# Patient Record
Sex: Male | Born: 1985 | Race: White | Hispanic: No | Smoking: Former smoker
Health system: Southern US, Community
[De-identification: ages and names within clinical notes are randomized; demographics above are authoritative.]

## PROBLEM LIST (undated history)

## (undated) DIAGNOSIS — R42 Dizziness and giddiness: Secondary | ICD-10-CM

## (undated) HISTORY — PX: APPENDECTOMY: SHX54

---
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
## 2007-03-02 ENCOUNTER — Observation Stay (HOSPITAL_COMMUNITY): Admission: EM | Admit: 2007-03-02 | Discharge: 2007-03-03 | Payer: Self-pay | Admitting: Emergency Medicine

## 2007-03-02 ENCOUNTER — Encounter (INDEPENDENT_AMBULATORY_CARE_PROVIDER_SITE_OTHER): Payer: Self-pay | Admitting: Specialist

## 2008-08-18 ENCOUNTER — Emergency Department (HOSPITAL_COMMUNITY): Admission: EM | Admit: 2008-08-18 | Discharge: 2008-08-19 | Payer: Self-pay | Admitting: Emergency Medicine

## 2009-05-04 ENCOUNTER — Emergency Department (HOSPITAL_COMMUNITY): Admission: EM | Admit: 2009-05-04 | Discharge: 2009-05-04 | Payer: Self-pay | Admitting: Emergency Medicine

## 2009-10-15 ENCOUNTER — Ambulatory Visit: Payer: Self-pay | Admitting: Diagnostic Radiology

## 2009-10-15 ENCOUNTER — Emergency Department (HOSPITAL_BASED_OUTPATIENT_CLINIC_OR_DEPARTMENT_OTHER): Admission: EM | Admit: 2009-10-15 | Discharge: 2009-10-15 | Payer: Self-pay | Admitting: Emergency Medicine

## 2009-10-29 ENCOUNTER — Emergency Department (HOSPITAL_BASED_OUTPATIENT_CLINIC_OR_DEPARTMENT_OTHER): Admission: EM | Admit: 2009-10-29 | Discharge: 2009-10-29 | Payer: Self-pay | Admitting: Emergency Medicine

## 2009-11-13 ENCOUNTER — Emergency Department (HOSPITAL_BASED_OUTPATIENT_CLINIC_OR_DEPARTMENT_OTHER): Admission: EM | Admit: 2009-11-13 | Discharge: 2009-11-14 | Payer: Self-pay | Admitting: Emergency Medicine

## 2010-04-07 ENCOUNTER — Emergency Department (HOSPITAL_BASED_OUTPATIENT_CLINIC_OR_DEPARTMENT_OTHER): Admission: EM | Admit: 2010-04-07 | Discharge: 2010-04-07 | Payer: Self-pay | Admitting: Emergency Medicine

## 2010-04-09 ENCOUNTER — Emergency Department (HOSPITAL_COMMUNITY): Admission: EM | Admit: 2010-04-09 | Discharge: 2010-04-09 | Payer: Self-pay | Admitting: Emergency Medicine

## 2010-04-11 ENCOUNTER — Emergency Department (HOSPITAL_BASED_OUTPATIENT_CLINIC_OR_DEPARTMENT_OTHER): Admission: EM | Admit: 2010-04-11 | Discharge: 2010-04-11 | Payer: Self-pay | Admitting: Emergency Medicine

## 2010-08-20 ENCOUNTER — Emergency Department (HOSPITAL_BASED_OUTPATIENT_CLINIC_OR_DEPARTMENT_OTHER): Admission: EM | Admit: 2010-08-20 | Discharge: 2010-08-20 | Payer: Self-pay | Admitting: Emergency Medicine

## 2010-08-20 ENCOUNTER — Ambulatory Visit: Payer: Self-pay | Admitting: Diagnostic Radiology

## 2010-10-17 ENCOUNTER — Emergency Department (HOSPITAL_BASED_OUTPATIENT_CLINIC_OR_DEPARTMENT_OTHER): Admission: EM | Admit: 2010-10-17 | Discharge: 2010-10-18 | Payer: Self-pay | Admitting: Emergency Medicine

## 2010-10-18 ENCOUNTER — Ambulatory Visit: Payer: Self-pay | Admitting: Diagnostic Radiology

## 2011-03-14 LAB — CULTURE, ROUTINE-ABSCESS

## 2011-03-29 LAB — URINALYSIS, ROUTINE W REFLEX MICROSCOPIC
Bilirubin Urine: NEGATIVE
Bilirubin Urine: NEGATIVE
Glucose, UA: NEGATIVE mg/dL
Glucose, UA: NEGATIVE mg/dL
Hgb urine dipstick: NEGATIVE
Hgb urine dipstick: NEGATIVE
Ketones, ur: NEGATIVE mg/dL
Ketones, ur: NEGATIVE mg/dL
Nitrite: NEGATIVE
Nitrite: NEGATIVE
Protein, ur: NEGATIVE mg/dL
Protein, ur: NEGATIVE mg/dL
Specific Gravity, Urine: 1.028 (ref 1.005–1.030)
Specific Gravity, Urine: 1.018 (ref 1.005–1.030)
Urobilinogen, UA: 0.2 mg/dL (ref 0.0–1.0)
Urobilinogen, UA: 0.2 mg/dL (ref 0.0–1.0)
pH: 6 (ref 5.0–8.0)
pH: 7 (ref 5.0–8.0)

## 2011-03-29 LAB — URINE MICROSCOPIC-ADD ON

## 2011-03-29 LAB — URINE CULTURE
Colony Count: NO GROWTH
Culture: NO GROWTH

## 2011-03-29 LAB — GC/CHLAMYDIA PROBE AMP, GENITAL
Chlamydia, DNA Probe: NEGATIVE
GC Probe Amp, Genital: NEGATIVE

## 2011-04-04 LAB — DIFFERENTIAL
Basophils Absolute: 0.1 10*3/uL (ref 0.0–0.1)
Basophils Relative: 1 % (ref 0–1)
Eosinophils Absolute: 0.1 10*3/uL (ref 0.0–0.7)
Eosinophils Relative: 1 % (ref 0–5)
Lymphocytes Relative: 34 % (ref 12–46)
Lymphs Abs: 2.1 10*3/uL (ref 0.7–4.0)
Monocytes Absolute: 0.7 10*3/uL (ref 0.1–1.0)
Monocytes Relative: 12 % (ref 3–12)
Neutro Abs: 3.2 10*3/uL (ref 1.7–7.7)
Neutrophils Relative %: 52 % (ref 43–77)

## 2011-04-04 LAB — D-DIMER, QUANTITATIVE: D-Dimer, Quant: 0.35 ug/mL-FEU (ref 0.00–0.48)

## 2011-04-04 LAB — POCT CARDIAC MARKERS
CKMB, poc: <1 ng/mL — ABNORMAL LOW (ref 1.0–8.0)
Myoglobin, poc: 55.3 ng/mL (ref 12–200)
Troponin i, poc: <0.05 ng/mL (ref 0.00–0.09)

## 2011-04-04 LAB — CBC
HCT: 45.3 % (ref 39.0–52.0)
Hemoglobin: 15.9 g/dL (ref 13.0–17.0)
MCHC: 35.2 g/dL (ref 30.0–36.0)
MCV: 86.6 fL (ref 78.0–100.0)
Platelets: 166 10*3/uL (ref 150–400)
RBC: 5.23 MIL/uL (ref 4.22–5.81)
RDW: 13.3 % (ref 11.5–15.5)
WBC: 6.1 10*3/uL (ref 4.0–10.5)

## 2011-04-04 LAB — BASIC METABOLIC PANEL
BUN: 14 mg/dL (ref 6–23)
CO2: 29 mEq/L (ref 19–32)
Calcium: 9.5 mg/dL (ref 8.4–10.5)
Chloride: 103 mEq/L (ref 96–112)
Creatinine, Ser: 1.09 mg/dL (ref 0.4–1.5)
GFR calc Af Amer: >60 mL/min (ref >60)
GFR calc non Af Amer: >60 mL/min (ref >60)
Glucose, Bld: 94 mg/dL (ref 70–99)
Potassium: 4 mEq/L (ref 3.5–5.1)
Sodium: 137 mEq/L (ref 135–145)

## 2011-05-12 NOTE — H&P (Signed)
[name], Page               ACCOUNT NO.:  [id number]   MEDICAL RECORD NO.:  [id number]          PATIENT TYPE:  INP   LOCATION:  5708                         FACILITY:  [facility]   PHYSICIAN:  Maisie Fus A. Cornett, M.D.DATE OF BIRTH:  1986/08/08   DATE OF ADMISSION:  03/02/2007  DATE OF DISCHARGE:                              HISTORY & PHYSICAL   CHIEF COMPLAINT:  Abdominal pain.   HISTORY OF PRESENT ILLNESS:  The patient is a 25 year old male with a 2-  day history of diffuse periumbilical abdominal pain which was mild to  moderate in intensity.  He described the pain as gas.  he woke up  today and the pain was localized in his right lower quadrant.  It was an  8 out of 10, was severe with no radiation.  The pain then progressively  worsened so he sought attention at [facility], who  referred him to the emergency room.  He is describing now right lower  quadrant pain which is moderate to severe in intensity without  radiation.  No associated nausea or vomiting.  He still does have his  appetite.  Denies any recent febrile disease.  CT scan revealed acute  appendicitis.  I was asked to see the patient at the request of Dr.  Ignacia Palma for this.   PAST MEDICAL HISTORY:  None.   PAST SURGICAL HISTORY:  None.   ALLERGIES:  None.   FAMILY HISTORY:  Negative for inflammatory bowel disease.   MEDICATIONS:  None currently.   SOCIAL HISTORY:  He is a smoker.   REVIEW OF SYSTEMS:  A 15-point review of systems otherwise negative  except for that stated above.   PHYSICAL EXAM:  Temperature 97.5, heart rate 63, blood pressure 122/64.  GENERAL APPEARANCE:  White male in no apparent distress.  HEENT:  Extraocular movements are intact.  There is no evidence of  scleral icterus.  Oropharynx moist.  NECK:  Supple, nontender.  Trachea midline.  CHEST:  Clear to auscultation.  Chest wall motion normal.  CARDIOVASCULAR:  Regular rate and rhythm without rub, murmur or gallop.  EXTREMITIES:  Well-perfused.  ABDOMEN:  Positive rebound.  Positive guarding over his right lower  quadrant at McBurney's point.  No mass or hernia noted.  EXTREMITIES:  Muscle tone normal.  Range of motion normal.  NEUROLOGICAL EXAMINATION:  Motor and sensory function are intact.  Cranial nerves II-XII are intact.  Glasgow coma scale 15.   DIAGNOSTIC STUDIES:  I reviewed his radiology reports which shows acute  appendicitis by CT scan done earlier.  No evidence of abscess  perforation.  He has a sodium of 140, potassium 4.1, chloride 103, CO2  27, BUN 13, creatinine 1.18.  Liver functions within normal limits.  He  has a white count of 8600 with no left shift.  Hematocrit 43.7, platelet  count is 173.  Urinalysis is normal.   IMPRESSION:  Acute appendicitis in a 25 year old white male.   PLAN:  I have recommended laparoscopic possible open appendectomy to Mr.  Canal today.  Discussed the procedure with him as well as complications  of bleeding, infection, injury to other organs, abscess formation and  the requirement to go open.  Also abdominal wall hernias and CO2  complications of embolism, cardiovascular and DVT complications.  He has  agreed to proceed.      Thomas A. Cornett, M.D.  Electronically Signed     TAC/MEDQ  D:  03/02/2007  T:  03/02/2007  Job:  [id number]

## 2011-05-12 NOTE — Op Note (Signed)
[name], Ricardo Page               ACCOUNT NO.:  [id number]   MEDICAL RECORD NO.:  [id number]          PATIENT TYPE:  INP   LOCATION:  1826                         FACILITY:  [facility]   PHYSICIAN:  Clovis Pu. Cornett, M.D.DATE OF BIRTH:  1986-08-31   DATE OF PROCEDURE:  DATE OF DISCHARGE:                               OPERATIVE REPORT   PREOPERATIVE DIAGNOSIS:  Acute appendicitis.   POSTOPERATIVE DIAGNOSIS:  Acute appendicitis.   PROCEDURE:  Laparoscopic appendectomy.   SURGEON:  Maisie Fus A. Cornett, M.D.   ANESTHESIA:  General endotracheal anesthesia with 0.25% Sensorcaine.   ESTIMATED BLOOD LOSS:  10 mL.   SPECIMEN:  Appendix to pathology.   INDICATIONS FOR PROCEDURE:  The patient is a 25 year old otherwise  healthy male with acute appendicitis.  He presents to the operating room  for laparoscopic appendectomy.   DESCRIPTION OF PROCEDURE:  The patient was brought to the operating room  and placed supine.  After induction of general anesthesia, his abdomen  was prepped and draped in a sterile fashion.  The right arm was left  out, the left arm was tucked.  A Foley catheter was placed.   A 1-cm incision was made just above the umbilicus.  Dissection was  carried down to his fascia.  The fascia was opened with a scalpel blade.  A pursestring suture of 0 Vicryl was placed in the fascia.  I placed my  finger in the fascial opening and into the abdominal cavity without  difficulty.  A 12-mm Hasson cannula was then placed under direct vision.  Pneumoperitoneum was created to 15 mmHg of CO2.  The laparoscope was  placed.  Laparoscopy was performed.  No evidence of solid or hollow  organ injury.  Gallbladder, stomach and liver all appeared normal.  A 5-  mm left lower quadrant port was placed, and a second 5-mm port was  placed in the right midabdomen.  The appendix was identified was acutely  inflamed.  The harmonic scalpel was used take the mesoappendix down  without difficulty,  with excellent hemostasis.  Once the interface  between the appendix and cecum were encountered, a GIA 35 stapler was  used to fire across the base of the appendix.  The appendix was placed  in an EndoCatch bag and extracted.  The appendiceal stump was examined  and found to be hemostatic with satisfactory closure.  Irrigation was  used and suctioned out.  The patient was flattened out.  Inspection of  the small bowel, large bowel, and remainder of the intra-abdominal  organs revealed no other abnormality or injury.  At this point in time,  the CO2 was allowed to escape.  The ports were removed.  The fascia at  the port site was closed with a pursestring suture of 0  Vicryl.  A 4-0 Monocryl was used to close the skin incisions.  All final  counts of sponge, needle, and instruments were found to be correct at  this portion of the case.  Sterile dressings were applied.   The patient was awakened and taken to recovery in satisfactory  condition.  Thomas A. Cornett, M.D.  Electronically Signed     TAC/MEDQ  D:  03/02/2007  T:  03/03/2007  Job:  [id number]

## 2013-05-26 ENCOUNTER — Encounter (HOSPITAL_COMMUNITY): Payer: Self-pay | Admitting: *Deleted

## 2013-05-26 ENCOUNTER — Emergency Department (HOSPITAL_COMMUNITY): Payer: BC Managed Care – PPO

## 2013-05-26 ENCOUNTER — Emergency Department (HOSPITAL_COMMUNITY)
Admission: EM | Admit: 2013-05-26 | Discharge: 2013-05-26 | Disposition: A | Discharge status: Home / Self Care | Payer: BC Managed Care – PPO | Attending: Emergency Medicine | Admitting: Emergency Medicine

## 2013-05-26 DIAGNOSIS — X58XXXA Exposure to other specified factors, initial encounter: Secondary | ICD-10-CM | POA: Insufficient documentation

## 2013-05-26 DIAGNOSIS — Y9375 Activity, martial arts: Secondary | ICD-10-CM | POA: Insufficient documentation

## 2013-05-26 DIAGNOSIS — S99929A Unspecified injury of unspecified foot, initial encounter: Secondary | ICD-10-CM | POA: Insufficient documentation

## 2013-05-26 DIAGNOSIS — Y9239 Other specified sports and athletic area as the place of occurrence of the external cause: Secondary | ICD-10-CM | POA: Insufficient documentation

## 2013-05-26 DIAGNOSIS — S8990XA Unspecified injury of unspecified lower leg, initial encounter: Secondary | ICD-10-CM | POA: Insufficient documentation

## 2013-05-26 DIAGNOSIS — S8992XA Unspecified injury of left lower leg, initial encounter: Secondary | ICD-10-CM

## 2013-05-26 DIAGNOSIS — Y92838 Other recreation area as the place of occurrence of the external cause: Secondary | ICD-10-CM | POA: Insufficient documentation

## 2013-05-26 MED ORDER — IBUPROFEN 800 MG PO TABS: 800.0000 mg | ORAL_TABLET | Freq: Three times a day (TID) | ORAL | Status: DC

## 2013-05-26 MED ORDER — HYDROCODONE-ACETAMINOPHEN 5-325 MG PO TABS: 1.0000 | ORAL_TABLET | ORAL | Status: DC | PRN

## 2013-05-26 MED ORDER — IBUPROFEN 800 MG PO TABS
800.0000 mg | ORAL_TABLET | Freq: Once | ORAL | Status: AC
  Administered 2013-05-26: 800 mg via ORAL
  Filled 2013-05-26: qty 1

## 2013-05-26 NOTE — ED Notes (Signed)
Pain rt knee , onset in martial arts training.

## 2013-05-26 NOTE — ED Provider Notes (Signed)
History     CSN: [id number]  Arrival date & time 05/26/13  2019   First MD Initiated Contact with Patient 05/26/13 2058      Chief Complaint  Patient presents with  . Knee Injury    (Consider location/radiation/quality/duration/timing/severity/associated sxs/prior treatment) HPI Ricardo Page is a 27 y.o. male who presents to the ED with right knee pain that started while he was in his martial arts training tonight. He states he was doing his training and felt a pulling sharp pain in his right knee. No other injuries. The history was provided by the patient.  History reviewed. No pertinent past medical history.  Past Surgical History  Procedure Laterality Date  . Appendectomy      History reviewed. No pertinent family history.  History  Substance Use Topics  . Smoking status: Never Smoker   . Smokeless tobacco: Not on file  . Alcohol Use: Yes      Review of Systems  Constitutional: Negative for fever.  HENT: Negative for neck pain.   Gastrointestinal: Negative for nausea and vomiting.  Musculoskeletal:       Right knee pain   Skin: Negative for wound.  Psychiatric/Behavioral: The patient is not nervous/anxious.     Allergies  Review of patient's allergies indicates no known allergies.  Home Medications  No current outpatient prescriptions on file.  BP 128/78  Pulse 79  Temp(Src) 98.4 F (36.9 C) (Oral)  Resp 24  Ht 6\' 1"  (1.854 m)  Wt 250 lb (113.399 kg)  BMI 32.99 kg/m2  SpO2 100%  Physical Exam  Nursing note and vitals reviewed. Constitutional: He is oriented to person, place, and time. He appears well-developed and well-nourished. No distress.  HENT:  Head: Atraumatic.  Eyes: EOM are normal.  Neck: Normal range of motion. Neck supple.  Cardiovascular: Normal rate.   Pulmonary/Chest: Effort normal.  Abdominal: Soft. There is no tenderness.  Musculoskeletal:       Right knee: He exhibits decreased range of motion and swelling. He exhibits no  laceration and no erythema. Tenderness found. MCL tenderness noted.  Pedal pulses strong and equal bilateral. Adequate circulation, good touch sensation. Pain with flexion and internal rotation.  Neurological: He is alert and oriented to person, place, and time. No cranial nerve deficit.  Skin: Skin is warm and dry.  Psychiatric: He has a normal mood and affect.    ED Course  Procedures (including critical care time)  Labs Reviewed - No data to display Dg Knee Complete 4 Views Right  05/26/2013   *RADIOLOGY REPORT*  Clinical Data: Knee pain.  Knee injury.  RIGHT KNEE - COMPLETE 4+ VIEW  Comparison: None.  Findings: Anatomic alignment.  No fracture.  Joint spaces preserved.  No effusion.  IMPRESSION: Negative.   Original Report Authenticated By: Andreas Newport, M.D.    MDM  27 y.o. male with right knee injury. I have reviewed this patient's vital signs, nurses notes, appropriate labs and imaging.  I have discussed findings with the patient and plan of care. Will apply knee immobilizer and he will use crutches, apply ice, elevate and follow up with ortho. Patient voices understanding. Patient stable for discharge home without any immediate complications, no signs of compartment syndrome at this time.   Medication List    TAKE these medications       HYDROcodone-acetaminophen 5-325 MG per tablet  Commonly known as:  NORCO/VICODIN  Take 1 tablet by mouth every 4 (four) hours as needed.  ibuprofen 800 MG tablet  Commonly known as:  ADVIL,MOTRIN  Take 1 tablet (800 mg total) by mouth 3 (three) times daily.               [facility] Orlene Och, [place] 05/27/13 [phone]

## 2013-05-27 NOTE — ED Provider Notes (Signed)
Medical screening examination/treatment/procedure(s) were performed by non-physician practitioner and as supervising physician I was immediately available for consultation/collaboration.  Donnetta Hutching, MD 05/27/13 [phone]

## 2014-01-09 ENCOUNTER — Emergency Department (HOSPITAL_COMMUNITY)
Admission: EM | Admit: 2014-01-09 | Discharge: 2014-01-10 | Disposition: A | Discharge status: Home / Self Care | Payer: Self-pay | Attending: Emergency Medicine | Admitting: Emergency Medicine

## 2014-01-09 ENCOUNTER — Emergency Department (HOSPITAL_COMMUNITY): Payer: Self-pay

## 2014-01-09 DIAGNOSIS — Z791 Long term (current) use of non-steroidal anti-inflammatories (NSAID): Secondary | ICD-10-CM | POA: Insufficient documentation

## 2014-01-09 DIAGNOSIS — J159 Unspecified bacterial pneumonia: Secondary | ICD-10-CM | POA: Insufficient documentation

## 2014-01-09 DIAGNOSIS — J189 Pneumonia, unspecified organism: Secondary | ICD-10-CM

## 2014-01-09 MED ORDER — PREDNISONE 50 MG PO TABS
60.0000 mg | ORAL_TABLET | Freq: Once | ORAL | Status: AC
  Administered 2014-01-09: 60 mg via ORAL
  Filled 2014-01-09 (×2): qty 1

## 2014-01-09 MED ORDER — IPRATROPIUM BROMIDE 0.02 % IN SOLN
0.5000 mg | Freq: Once | RESPIRATORY_TRACT | Status: AC
  Administered 2014-01-10: 0.5 mg via RESPIRATORY_TRACT
  Filled 2014-01-09: qty 2.5

## 2014-01-09 MED ORDER — ALBUTEROL SULFATE (2.5 MG/3ML) 0.083% IN NEBU
5.0000 mg | INHALATION_SOLUTION | Freq: Once | RESPIRATORY_TRACT | Status: AC
  Administered 2014-01-10: 5 mg via RESPIRATORY_TRACT
  Filled 2014-01-09: qty 6

## 2014-01-09 NOTE — ED Notes (Signed)
Pt states symptoms are worse when coughing

## 2014-01-09 NOTE — ED Notes (Signed)
Chest wall pain with cough x 4 days

## 2014-01-09 NOTE — ED Provider Notes (Signed)
CSN: [id number]     Arrival date & time 01/09/14  2307 History   First MD Initiated Contact with Patient 01/09/14 2318     Chief Complaint  Patient presents with  . chest wall pain    . Cough   (Consider location/radiation/quality/duration/timing/severity/associated sxs/prior Treatment) HPI Comments: JOHNRYAN SAO is a 28 y.o. male who presents to the Emergency Department complaining of cough for 4 days.  States the cough is productive of green sputum at times. He states that he developed chest tightness 2-3 hrs prior to calling EMS. States the episode lasted approximately 15 minutes.   States the tightness began after forceful coughing.  He also c/o "soreness" to his chest with deep breathing and palpation over his sternum.  He reports some nasal congestion as well.  He denies fever, chills, abdominal pain, shortness of breath, hx of PNA, smoking or drug use.    Patient is a 28 y.o. male presenting with cough. The history is provided by the patient.  Cough Associated symptoms: chest pain and rhinorrhea   Associated symptoms: no chills, no fever, no headaches, no rash, no shortness of breath, no sore throat and no wheezing     No past medical history on file. Past Surgical History  Procedure Laterality Date  . Appendectomy     No family history on file. History  Substance Use Topics  . Smoking status: Never Smoker   . Smokeless tobacco: Not on file  . Alcohol Use: Yes    Review of Systems  Constitutional: Negative for fever, chills, activity change and appetite change.  HENT: Positive for congestion and rhinorrhea. Negative for facial swelling, sore throat and trouble swallowing.   Eyes: Negative for visual disturbance.  Respiratory: Positive for cough and chest tightness. Negative for shortness of breath, wheezing and stridor.   Cardiovascular: Positive for chest pain.  Gastrointestinal: Negative for nausea, vomiting, abdominal pain and abdominal distention.  Genitourinary:  Negative for dysuria and flank pain.  Musculoskeletal: Negative for back pain, neck pain and neck stiffness.  Skin: Negative.  Negative for rash.  Neurological: Negative for dizziness, weakness, numbness and headaches.  Hematological: Negative for adenopathy.  Psychiatric/Behavioral: Negative for confusion.  All other systems reviewed and are negative.    Allergies  Review of patient's allergies indicates no known allergies.  Home Medications   Current Outpatient Rx  Name  Route  Sig  Dispense  Refill  . HYDROcodone-acetaminophen (NORCO/VICODIN) 5-325 MG per tablet   Oral   Take 1 tablet by mouth every 4 (four) hours as needed.   15 tablet   0   . ibuprofen (ADVIL,MOTRIN) 800 MG tablet   Oral   Take 1 tablet (800 mg total) by mouth 3 (three) times daily.   21 tablet   0    BP 119/67  Pulse 79  Temp(Src) 98.5 F (36.9 C) (Oral)  Resp 18  Ht 6\' 1"  (1.854 m)  Wt 263 lb (119.296 kg)  BMI 34.71 kg/m2  SpO2 97%  Physical Exam  Nursing note and vitals reviewed. Constitutional: He is oriented to person, place, and time. He appears well-developed and well-nourished. No distress.  HENT:  Head: Normocephalic and atraumatic.  Right Ear: Tympanic membrane and ear canal normal.  Left Ear: Tympanic membrane and ear canal normal.  Mouth/Throat: Uvula is midline and mucous membranes are normal. Posterior oropharyngeal erythema present. No oropharyngeal exudate, posterior oropharyngeal edema or tonsillar abscesses.  Eyes: EOM are normal. Pupils are equal, round, and reactive to light.  Neck: Normal range of motion, full passive range of motion without pain and phonation normal. Neck supple.  Cardiovascular: Normal rate, regular rhythm, normal heart sounds and intact distal pulses.   No murmur heard. Pulmonary/Chest: Effort normal. No stridor. No respiratory distress. He has wheezes. He has no rales. He exhibits tenderness. He exhibits no mass, no laceration, no crepitus, no edema,  no deformity, no swelling and no retraction.    Slightly diminished lungs sounds bilaterally with few expiratory and inspiratory wheezes.  No rales or stridor  Abdominal: Soft. He exhibits no distension. There is no tenderness. There is no rebound.  Musculoskeletal: He exhibits no edema.  Lymphadenopathy:    He has no cervical adenopathy.  Neurological: He is alert and oriented to person, place, and time. He exhibits normal muscle tone. Coordination normal.  Skin: Skin is warm and dry.    ED Course  Procedures (including critical care time) Labs Review Labs Reviewed - No data to display Imaging Review Dg Chest 2 View  01/10/2014   CLINICAL DATA:  Productive cough, chest tightness, shortness of breath  EXAM: CHEST  2 VIEW  COMPARISON:  05/04/2009  FINDINGS: Although equivocal, very mild patchy right upper lobe opacity is possible. Pneumonia is not excluded.  Lungs otherwise clear.  No pleural effusion or pneumothorax.  The heart is normal in size.  Visualized osseous structures are within normal limits.  IMPRESSION: Possible mild patchy right upper lobe opacity, equivocal, pneumonia not excluded.   Electronically Signed   By: Charline BillsSriyesh  Krishnan M.D.   On: 01/10/2014 00:19    EKG Interpretation   None       MDM    CXR results reviewed and discussed with pateint.  VSS.  Patient is well appearing.  No concerning sx's for PE.    Lung sounds improved after albuterol neb.  Prednisone given.  Patient is feeling better.  VSS.  Will treat with zithromax , albuterol inhaler dispensed.  Pt agrees to return here if sx's worsen, tylenol and ibuprofen if needed for fever.   Berlyn Saylor L. Trisha Mangleriplett, PA-C 01/10/14 [phone]

## 2014-01-10 MED ORDER — AZITHROMYCIN 250 MG PO TABS
500.0000 mg | ORAL_TABLET | Freq: Once | ORAL | Status: AC
  Administered 2014-01-10: 500 mg via ORAL
  Filled 2014-01-10: qty 2

## 2014-01-10 MED ORDER — ALBUTEROL SULFATE HFA 108 (90 BASE) MCG/ACT IN AERS
2.0000 | INHALATION_SPRAY | Freq: Once | RESPIRATORY_TRACT | Status: AC
  Administered 2014-01-10: 2 via RESPIRATORY_TRACT
  Filled 2014-01-10: qty 6.7

## 2014-01-10 MED ORDER — AZITHROMYCIN 250 MG PO TABS: ORAL_TABLET | ORAL | Status: DC

## 2014-01-10 NOTE — ED Provider Notes (Signed)
Medical screening examination/treatment/procedure(s) were performed by non-physician practitioner and as supervising physician I was immediately available for consultation/collaboration.  EKG Interpretation   None        Desirre Eickhoff, MD 01/10/14 0629 

## 2014-01-10 NOTE — Discharge Instructions (Signed)
Pneumonia, Adult °Pneumonia is an infection of the lungs. It may be caused by a germ (virus or bacteria). Some types of pneumonia can spread easily from person to person. This can happen when you cough or sneeze. °HOME CARE °· Only take medicine as told by your doctor. °· Take your medicine (antibiotics) as told. Finish it even if you start to feel better. °· Do not smoke. °· You may use a vaporizer or humidifier in your room. This can help loosen thick spit (mucus). °· Sleep so you are almost sitting up (semi-upright). This helps reduce coughing. °· Rest. °A shot (vaccine) can help prevent pneumonia. Shots are often advised for: °· People over 65 years old. °· Patients on chemotherapy. °· People with long-term (chronic) lung problems. °· People with immune system problems. °GET HELP RIGHT AWAY IF:  °· You are getting worse. °· You cannot control your cough, and you are losing sleep. °· You cough up blood. °· Your pain gets worse, even with medicine. °· You have a fever. °· Any of your problems are getting worse, not better. °· You have shortness of breath or chest pain. °MAKE SURE YOU:  °· Understand these instructions. °· Will watch your condition. °· Will get help right away if you are not doing well or get worse. °Document Released: 05/29/2008 Document Revised: 03/04/2012 Document Reviewed: 03/03/2011 °[employer]® Patient Information ©2014 [employer]. ° °

## 2014-01-10 NOTE — ED Notes (Signed)
Patient given discharge instruction, verbalized understand. IV removed, band aid applied. Patient ambulatory out of the department.  

## 2014-08-24 ENCOUNTER — Other Ambulatory Visit: Payer: Self-pay | Admitting: Family Medicine

## 2015-05-27 ENCOUNTER — Encounter (HOSPITAL_BASED_OUTPATIENT_CLINIC_OR_DEPARTMENT_OTHER): Payer: Self-pay | Admitting: *Deleted

## 2015-05-27 ENCOUNTER — Emergency Department (HOSPITAL_BASED_OUTPATIENT_CLINIC_OR_DEPARTMENT_OTHER)
Admission: EM | Admit: 2015-05-27 | Discharge: 2015-05-27 | Disposition: A | Discharge status: Home / Self Care | Payer: No Typology Code available for payment source | Attending: Emergency Medicine | Admitting: Emergency Medicine

## 2015-05-27 DIAGNOSIS — M545 Low back pain, unspecified: Secondary | ICD-10-CM

## 2015-05-27 LAB — URINALYSIS, ROUTINE W REFLEX MICROSCOPIC
BILIRUBIN URINE: NEGATIVE
GLUCOSE, UA: NEGATIVE mg/dL
Hgb urine dipstick: NEGATIVE
KETONES UR: NEGATIVE mg/dL
Leukocytes, UA: NEGATIVE
Nitrite: NEGATIVE
PH: 7 (ref 5.0–8.0)
Protein, ur: NEGATIVE mg/dL
Specific Gravity, Urine: 1.019 (ref 1.005–1.030)
Urobilinogen, UA: 0.2 mg/dL (ref 0.0–1.0)

## 2015-05-27 MED ORDER — IBUPROFEN 800 MG PO TABS: 800.0000 mg | ORAL_TABLET | Freq: Three times a day (TID) | ORAL | Status: DC

## 2015-05-27 MED ORDER — CYCLOBENZAPRINE HCL 5 MG PO TABS: 5.0000 mg | ORAL_TABLET | Freq: Two times a day (BID) | ORAL | Status: DC | PRN

## 2015-05-27 NOTE — ED Provider Notes (Signed)
CSN: [id number]     Arrival date & time 05/27/15  1121 History   First MD Initiated Contact with Patient 05/27/15 1201     Chief Complaint  Patient presents with  . Flank Pain     (Consider location/radiation/quality/duration/timing/severity/associated sxs/prior Treatment) HPI Comments: Pt c/o left lower back pain that started last night. Worse with movement. Denies dysuria. No known injury. Denies numbness, weakness, or incontinence. Denies fever. Hasn't taken anything for the symptoms. States that he has had some back problems in the past. States that certain position are better then others  The history is provided by the patient. No language interpreter was used.    History reviewed. No pertinent past medical history. Past Surgical History  Procedure Laterality Date  . Appendectomy     No family history on file. History  Substance Use Topics  . Smoking status: Never Smoker   . Smokeless tobacco: Not on file  . Alcohol Use: Yes    Review of Systems  All other systems reviewed and are negative.     Allergies  Review of patient's allergies indicates no known allergies.  Home Medications   Prior to Admission medications   Medication Sig Start Date End Date Taking? Authorizing Provider  azithromycin (ZITHROMAX Z-PAK) 250 MG tablet Take two tablets on day one, then one tab qd days 2-5 01/10/14   Tammy Triplett, PA-C  cyclobenzaprine (FLEXERIL) 5 MG tablet Take 1 tablet (5 mg total) by mouth 2 (two) times daily as needed. 05/27/15   Teressa LowerVrinda Zerline Melchior, NP  HYDROcodone-acetaminophen (NORCO/VICODIN) 5-325 MG per tablet Take 1 tablet by mouth every 4 (four) hours as needed. 05/26/13   Hope Orlene OchM Neese, NP  ibuprofen (ADVIL,MOTRIN) 800 MG tablet Take 1 tablet (800 mg total) by mouth 3 (three) times daily. 05/27/15   Teressa LowerVrinda Chaunta Bejarano, NP   BP 125/67 mmHg  Pulse 78  Temp(Src) 98.6 F (37 C) (Oral)  Resp 18  Ht 6\' 1"  (1.854 m)  Wt 265 lb (120.203 kg)  BMI 34.97 kg/m2  SpO2 100% Physical  Exam  Constitutional: He is oriented to person, place, and time. He appears well-developed and well-nourished.  Cardiovascular: Normal rate and regular rhythm.   Pulmonary/Chest: Effort normal and breath sounds normal.  Abdominal: Soft. Bowel sounds are normal.  Musculoskeletal:  Left lumbar paraspinal tenderness. Equal strength and strength and sensation to bilateral lower extremities.  Neurological: He is alert and oriented to person, place, and time. He exhibits normal muscle tone. Coordination normal.  Skin: Skin is warm and dry.  Nursing note and vitals reviewed.   ED Course  Procedures (including critical care time) Labs Review Labs Reviewed  URINALYSIS, ROUTINE W REFLEX MICROSCOPIC (NOT [facility])    Imaging Review No results found.   EKG Interpretation None      MDM   Final diagnoses:  Left-sided low back pain without sciatica    No red flags. Don't think it is a kidney stone. Pt given flexeril and ibuprofen for pain    Teressa LowerVrinda Skyleigh Windle, NP 05/27/15 1230  Zadie Rhineonald Wickline, MD 05/27/15 1447

## 2015-05-27 NOTE — ED Notes (Signed)
Left flank pain since last night

## 2015-05-27 NOTE — Discharge Instructions (Signed)
Back Pain, Adult Low back pain is very common. About 1 in 5 people have back pain.The cause of low back pain is rarely dangerous. The pain often gets better over time.About half of people with a sudden onset of back pain feel better in just 2 weeks. About 8 in 10 people feel better by 6 weeks.  CAUSES Some common causes of back pain include:  Strain of the muscles or ligaments supporting the spine.  Wear and tear (degeneration) of the spinal discs.  Arthritis.  Direct injury to the back. DIAGNOSIS Most of the time, the direct cause of low back pain is not known.However, back pain can be treated effectively even when the exact cause of the pain is unknown.Answering your caregiver's questions about your overall health and symptoms is one of the most accurate ways to make sure the cause of your pain is not dangerous. If your caregiver needs more information, he or she may order lab work or imaging tests (X-rays or MRIs).However, even if imaging tests show changes in your back, this usually does not require surgery. HOME CARE INSTRUCTIONS For many people, back pain returns.Since low back pain is rarely dangerous, it is often a condition that people can learn to manageon their own.   Remain active. It is stressful on the back to sit or stand in one place. Do not sit, drive, or stand in one place for more than 30 minutes at a time. Take short walks on level surfaces as soon as pain allows.Try to increase the length of time you walk each day.  Do not stay in bed.Resting more than 1 or 2 days can delay your recovery.  Do not avoid exercise or work.Your body is made to move.It is not dangerous to be active, even though your back may hurt.Your back will likely heal faster if you return to being active before your pain is gone.  Pay attention to your body when you bend and lift. Many people have less discomfortwhen lifting if they bend their knees, keep the load close to their bodies,and  avoid twisting. Often, the most comfortable positions are those that put less stress on your recovering back.  Find a comfortable position to sleep. Use a firm mattress and lie on your side with your knees slightly bent. If you lie on your back, put a pillow under your knees.  Only take over-the-counter or prescription medicines as directed by your caregiver. Over-the-counter medicines to reduce pain and inflammation are often the most helpful.Your caregiver may prescribe muscle relaxant drugs.These medicines help dull your pain so you can more quickly return to your normal activities and healthy exercise.  Put ice on the injured area.  Put ice in a plastic bag.  Place a towel between your skin and the bag.  Leave the ice on for 15-20 minutes, 03-04 times a day for the first 2 to 3 days. After that, ice and heat may be alternated to reduce pain and spasms.  Ask your caregiver about trying back exercises and gentle massage. This may be of some benefit.  Avoid feeling anxious or stressed.Stress increases muscle tension and can worsen back pain.It is important to recognize when you are anxious or stressed and learn ways to manage it.Exercise is a great option. SEEK MEDICAL CARE IF:  You have pain that is not relieved with rest or medicine.  You have pain that does not improve in 1 week.  You have new symptoms.  You are generally not feeling well. SEEK   IMMEDIATE MEDICAL CARE IF:   You have pain that radiates from your back into your legs.  You develop new bowel or bladder control problems.  You have unusual weakness or numbness in your arms or legs.  You develop nausea or vomiting.  You develop abdominal pain.  You feel faint. Document Released: 12/11/2005 Document Revised: 06/11/2012 Document Reviewed: 04/14/2014 Exit[employer] Patient Information 2015 [employer]. This information is not intended to replace advice given to you by your health care provider. Make sure you  discuss any questions you have with your health care provider.  

## 2016-06-02 ENCOUNTER — Other Ambulatory Visit: Payer: Self-pay | Admitting: Occupational Medicine

## 2016-06-02 ENCOUNTER — Ambulatory Visit: Payer: Self-pay

## 2016-06-02 DIAGNOSIS — M25562 Pain in left knee: Secondary | ICD-10-CM

## 2016-08-07 ENCOUNTER — Emergency Department (HOSPITAL_COMMUNITY)
Admission: EM | Admit: 2016-08-07 | Discharge: 2016-08-07 | Disposition: A | Discharge status: Home / Self Care | Payer: 59 | Attending: Emergency Medicine | Admitting: Emergency Medicine

## 2016-08-07 ENCOUNTER — Emergency Department (HOSPITAL_COMMUNITY): Payer: 59

## 2016-08-07 ENCOUNTER — Encounter (HOSPITAL_COMMUNITY): Payer: Self-pay | Admitting: *Deleted

## 2016-08-07 DIAGNOSIS — Y9239 Other specified sports and athletic area as the place of occurrence of the external cause: Secondary | ICD-10-CM | POA: Diagnosis not present

## 2016-08-07 DIAGNOSIS — Z79899 Other long term (current) drug therapy: Secondary | ICD-10-CM | POA: Diagnosis not present

## 2016-08-07 DIAGNOSIS — Y9367 Activity, basketball: Secondary | ICD-10-CM | POA: Diagnosis not present

## 2016-08-07 DIAGNOSIS — Y999 Unspecified external cause status: Secondary | ICD-10-CM | POA: Insufficient documentation

## 2016-08-07 DIAGNOSIS — M25562 Pain in left knee: Secondary | ICD-10-CM | POA: Diagnosis not present

## 2016-08-07 DIAGNOSIS — X501XXA Overexertion from prolonged static or awkward postures, initial encounter: Secondary | ICD-10-CM | POA: Insufficient documentation

## 2016-08-07 MED ORDER — NAPROXEN 500 MG PO TABS
500.0000 mg | ORAL_TABLET | Freq: Two times a day (BID) | ORAL | 0 refills | Status: DC
Start: 2016-08-07 — End: 2016-12-04

## 2016-08-07 MED ORDER — HYDROCODONE-ACETAMINOPHEN 5-325 MG PO TABS: 1.0000 | ORAL_TABLET | ORAL | 0 refills | Status: DC | PRN

## 2016-08-07 MED ORDER — KETOROLAC TROMETHAMINE 60 MG/2ML IM SOLN
30.0000 mg | Freq: Once | INTRAMUSCULAR | Status: AC
  Administered 2016-08-07: 30 mg via INTRAMUSCULAR
  Filled 2016-08-07: qty 2

## 2016-08-07 NOTE — ED Triage Notes (Signed)
The pt was working out last pm  Twisted his lt knee and I felt like it slipped olut of place  Ice and elevation has not helped the pain

## 2016-08-07 NOTE — ED Provider Notes (Signed)
[facility]-EMERGENCY DEPT Provider Note   CSN: [id number] Arrival date & time: 08/07/16  [id number]  First Provider Contact:  First MD Initiated Contact with Patient 08/07/16 0555     History   Chief Complaint Chief Complaint  Patient presents with  . Knee Pain   HPI  Ricardo Page is an 30 y.o. male with no significant PMH who presents to the ED for evaluation of left knee pain. He states he was in his usual state of health until yesterday evening when he was playing basketball at the gym when he thinks he landed after a jump shot and pivoted, then suddenly felt a sharp pain and pop in his left knee. He states his knee buckled but he did not fall. He states that since then he has had constant, worsening pain along the medial and superior aspect of his left knee. He has taken ibuprofen at home about six hours ago with no relief. He states he is still able to bear weight and ambulate though it is painful. He states he has injured this knee before in high school and was told he had a meniscal injury but never required surgery. Denies fever or chills. Denies new numbness, weakness, or tingling. He states he typically follows with [facility].  History reviewed. No pertinent past medical history.  There are no active problems to display for this patient.   Past Surgical History:  Procedure Laterality Date  . APPENDECTOMY         Home Medications    Prior to Admission medications   Medication Sig Start Date End Date Taking? Authorizing Provider  azithromycin (ZITHROMAX Z-PAK) 250 MG tablet Take two tablets on day one, then one tab qd days 2-5 01/10/14   Tammy Triplett, PA-C  cyclobenzaprine (FLEXERIL) 5 MG tablet Take 1 tablet (5 mg total) by mouth 2 (two) times daily as needed. 05/27/15   Teressa LowerVrinda Pickering, NP  HYDROcodone-acetaminophen (NORCO/VICODIN) 5-325 MG per tablet Take 1 tablet by mouth every 4 (four) hours as needed. 05/26/13   Hope Orlene OchM Neese, NP  ibuprofen (ADVIL,MOTRIN) 800 MG  tablet Take 1 tablet (800 mg total) by mouth 3 (three) times daily. 05/27/15   Teressa LowerVrinda Pickering, NP    Family History No family history on file.  Social History Social History  Substance Use Topics  . Smoking status: Never Smoker  . Smokeless tobacco: Never Used  . Alcohol use Yes     Allergies   Review of patient's allergies indicates no known allergies.   Review of Systems Review of Systems 10 Systems reviewed and are negative for acute change except as noted in the HPI.  Physical Exam Updated Vital Signs BP 119/69   Pulse 66   Temp 97.8 F (36.6 C)   Resp 16   Ht 6\' 1"  (1.854 m)   Wt 114.8 kg   SpO2 97%   BMI 33.38 kg/m   Physical Exam  Constitutional: He is oriented to person, place, and time. No distress.  HENT:  Head: Atraumatic.  Right Ear: External ear normal.  Left Ear: External ear normal.  Nose: Nose normal.  Eyes: Conjunctivae are normal. No scleral icterus.  Cardiovascular: Normal rate and regular rhythm.   Pulmonary/Chest: Effort normal. No respiratory distress.  Abdominal: He exhibits no distension.  Musculoskeletal:  Left knee with tenderness along medial edge with particular tenderness at 11:00 position. No discoloration. Minimal generalized edema. Limited ROM due to pain. Negative lachman's and anterior drawer test. +pain with valgus stress 2+DP and  PT  Neurological: He is alert and oriented to person, place, and time.  Skin: Skin is warm and dry. He is not diaphoretic.  Psychiatric: He has a normal mood and affect. His behavior is normal.  Nursing note and vitals reviewed.    ED Treatments / Results  Labs (all labs ordered are listed, but only abnormal results are displayed) Labs Reviewed - No data to display  EKG  EKG Interpretation None       Radiology Dg Knee Complete 4 Views Left  Result Date: 08/07/2016 CLINICAL DATA:  Initial evaluation for acute medial knee pain status post basketball injury. EXAM: LEFT KNEE - COMPLETE 4+  VIEW COMPARISON:  Prior radiograph from 06/02/2016. FINDINGS: No evidence of fracture, dislocation, or joint effusion. No evidence of arthropathy or other focal bone abnormality. Soft tissues are unremarkable. IMPRESSION: No acute osseous abnormality identified about the left knee. Electronically Signed   By: Rise MuBenjamin  McClintock M.D.   On: 08/07/2016 06:25    Procedures Procedures (including critical care time)  Medications Ordered in ED Medications  ketorolac (TORADOL) injection 30 mg (30 mg Intramuscular Given 08/07/16 0707)     Initial Impression / Assessment and Plan / ED Course  I have reviewed the triage vital signs and the nursing notes.  Pertinent labs & imaging results that were available during my care of the patient were reviewed by me and considered in my medical decision making (see chart for details).  Clinical Course   X-ray negative for acute findings. I do suspect pt likely has some meniscal injury, and/or ligamentous injury. He is neurovascularly intact. Discussed with pt no indication for emergent MRI at this time. He can bear weight though it is painful. No overt laxity on exam. Will place in knee immobilizer, provide crutches, and provide rx for pain meds. Encouraged close ortho f/u. Pt has seen [facility] in the past. Work note given per pt request. ER return precautions given.  Final Clinical Impressions(s) / ED Diagnoses   Final diagnoses:  Left knee pain    New Prescriptions New Prescriptions   HYDROCODONE-ACETAMINOPHEN (NORCO/VICODIN) 5-325 MG TABLET    Take 1 tablet by mouth every 4 (four) hours as needed for severe pain.   NAPROXEN (NAPROSYN) 500 MG TABLET    Take 1 tablet (500 mg total) by mouth 2 (two) times daily.     Carlene CoriaSerena Y Kazuki Ingle, PA-C 08/07/16 [id number]    Charlynne Panderavid Hsienta Yao, MD 08/11/16 1539

## 2016-08-07 NOTE — Progress Notes (Signed)
Orthopedic Tech Progress Note Patient Details:  Ricardo GeraldsMichael R Page 11/05/1986 [id number]  Ortho Devices Type of Ortho Device: Crutches, Knee Immobilizer Ortho Device/Splint Location: lle Ortho Device/Splint Interventions: Ordered, Application   Trinna PostMartinez, Dyanna Seiter J 08/07/2016, 7:30 AM

## 2016-08-07 NOTE — Discharge Instructions (Signed)
Take pain medications as prescribed as needed for pain. Please call [facility] to schedule a follow up appointment as soon as possible. Your knee x-ray today was unremarkable but as we discussed I do suspect you might have a meniscal injury, or injury of your other soft tissues. Keep you leg elevated and use ice on and off for the next 48 hrs. Return to the ER for new or worsening symptoms.

## 2016-12-03 DIAGNOSIS — W231XXA Caught, crushed, jammed, or pinched between stationary objects, initial encounter: Secondary | ICD-10-CM | POA: Insufficient documentation

## 2016-12-03 DIAGNOSIS — Y929 Unspecified place or not applicable: Secondary | ICD-10-CM | POA: Diagnosis not present

## 2016-12-03 DIAGNOSIS — S5012XA Contusion of left forearm, initial encounter: Secondary | ICD-10-CM | POA: Diagnosis not present

## 2016-12-03 DIAGNOSIS — Y999 Unspecified external cause status: Secondary | ICD-10-CM | POA: Insufficient documentation

## 2016-12-03 DIAGNOSIS — S59912A Unspecified injury of left forearm, initial encounter: Secondary | ICD-10-CM | POA: Diagnosis present

## 2016-12-03 DIAGNOSIS — Y939 Activity, unspecified: Secondary | ICD-10-CM | POA: Diagnosis not present

## 2016-12-04 ENCOUNTER — Emergency Department (HOSPITAL_COMMUNITY)
Admission: EM | Admit: 2016-12-04 | Discharge: 2016-12-04 | Disposition: A | Discharge status: Home / Self Care | Payer: 59 | Attending: Emergency Medicine | Admitting: Emergency Medicine

## 2016-12-04 ENCOUNTER — Emergency Department (HOSPITAL_COMMUNITY): Payer: 59

## 2016-12-04 ENCOUNTER — Encounter (HOSPITAL_COMMUNITY): Payer: Self-pay

## 2016-12-04 DIAGNOSIS — S4992XA Unspecified injury of left shoulder and upper arm, initial encounter: Secondary | ICD-10-CM

## 2016-12-04 MED ORDER — NAPROXEN 500 MG PO TABS: 500.0000 mg | ORAL_TABLET | Freq: Two times a day (BID) | ORAL | 0 refills | Status: AC

## 2016-12-04 MED ORDER — IBUPROFEN 400 MG PO TABS
800.0000 mg | ORAL_TABLET | Freq: Once | ORAL | Status: AC
  Administered 2016-12-04: 800 mg via ORAL
  Filled 2016-12-04: qty 2

## 2016-12-04 NOTE — ED Provider Notes (Signed)
[facility]PT Provider Note   CSN: [id number] Arrival date & time: 12/03/16  2357     History   Chief Complaint Chief Complaint  Patient presents with  . Arm Injury    HPI Ricardo Page is a 30 y.o. male who presents to the ED with arm pain. Patient reports that his left arm got caught between 50 pound speaker and a table after the speaker fell. He complains of swelling and pain to the left forearm.  The history is provided by the patient. No language interpreter was used.  Arm Injury   This is a new problem. The current episode started 1 to 2 hours ago. The pain is present in the left arm. He has tried nothing for the symptoms.    History reviewed. No pertinent past medical history.  There are no active problems to display for this patient.   Past Surgical History:  Procedure Laterality Date  . APPENDECTOMY         Home Medications    Prior to Admission medications   Medication Sig Start Date End Date Taking? Authorizing Provider  azithromycin (ZITHROMAX Z-PAK) 250 MG tablet Take two tablets on day one, then one tab qd days 2-5 Patient not taking: Reported on 08/07/2016 01/10/14   Tammy Triplett, PA-C  cyclobenzaprine (FLEXERIL) 5 MG tablet Take 1 tablet (5 mg total) by mouth 2 (two) times daily as needed. Patient not taking: Reported on 08/07/2016 05/27/15   Teressa LowerVrinda Pickering, NP  HYDROcodone-acetaminophen (NORCO/VICODIN) 5-325 MG per tablet Take 1 tablet by mouth every 4 (four) hours as needed. Patient not taking: Reported on 08/07/2016 05/26/13   Janne NapoleonHope M Neese, NP  HYDROcodone-acetaminophen (NORCO/VICODIN) 5-325 MG tablet Take 1 tablet by mouth every 4 (four) hours as needed for severe pain. 08/07/16   Ace GinsSerena Y Sam, PA-C  ibuprofen (ADVIL,MOTRIN) 200 MG tablet Take 200 mg by mouth every 6 (six) hours as needed for mild pain.    Historical Provider, MD  ibuprofen (ADVIL,MOTRIN) 800 MG tablet Take 1 tablet (800 mg total) by mouth 3 (three) times daily. Patient not  taking: Reported on 08/07/2016 05/27/15   Teressa LowerVrinda Pickering, NP  naproxen (NAPROSYN) 500 MG tablet Take 1 tablet (500 mg total) by mouth 2 (two) times daily. 08/07/16   Carlene CoriaSerena Y Sam, PA-C    Family History History reviewed. No pertinent family history.  Social History Social History  Substance Use Topics  . Smoking status: Never Smoker  . Smokeless tobacco: Never Used  . Alcohol use Yes     Allergies   Patient has no known allergies.   Review of Systems Review of Systems  Musculoskeletal: Positive for arthralgias.       Left arm pain     Physical Exam Updated Vital Signs BP 144/78 (BP Location: Right Arm)   Pulse 69   Temp 97.7 F (36.5 C) (Oral)   Resp 16   Ht 6\' 1"  (1.854 m)   Wt 120.7 kg   SpO2 99%   BMI 35.09 kg/m   Physical Exam  Constitutional: He is oriented to person, place, and time. He appears well-developed and well-nourished. No distress.  HENT:  Head: Normocephalic and atraumatic.  Eyes: EOM are normal.  Neck: Normal range of motion. Neck supple.  Cardiovascular: Normal rate.   Pulmonary/Chest: Effort normal.  Musculoskeletal:       Left forearm: He exhibits tenderness and swelling.  Radial pulse 2+, adequate circulation.   Neurological: He is alert and oriented to person, place, and  time. No cranial nerve deficit.  Skin: Skin is warm and dry.  Psychiatric: He has a normal mood and affect. His behavior is normal.  Nursing note and vitals reviewed.    ED Treatments / Results  Labs (all labs ordered are listed, but only abnormal results are displayed) Labs Reviewed - No data to display  Radiology No results found.  Procedures Procedures (including critical care time)  Medications Ordered in ED Medications  ibuprofen (ADVIL,MOTRIN) tablet 800 mg (800 mg Oral Given 12/04/16 0127)     Initial Impression / Assessment and Plan / ED Course  I have reviewed the triage vital signs and the nursing notes.  Clinical Course   patient awaiting  x-ray results. Care turned over to Melburn HakeNicole Nadeau, [facility]   Final Clinical Impressions(s) / ED Diagnoses   New Prescriptions New Prescriptions   No medications on file     [facility] Neese, NP 12/04/16 0155    Layla MawKristen N Ward, DO 12/08/16 0001

## 2016-12-04 NOTE — ED Provider Notes (Signed)
[facility] from [facility] Ne[facility], NP. Pending xrays.  Patient is a 30 year old male who presents the ED with complaint of left arm pain. He reports his left arm got caught between a 50 pound speaker and a table due to a falling this afternoon. He reports associated pain and swelling to his left forearm.  Physical Exam  BP 144/78 (BP Location: Right Arm)   Pulse 69   Temp 97.7 F (36.5 C) (Oral)   Resp 16   Ht 6\' 1"  (1.854 m)   Wt 120.7 kg   SpO2 99%   BMI 35.09 kg/m   Physical Exam  Constitutional: He is oriented to person, place, and time. He appears well-developed and well-nourished.  HENT:  Head: Normocephalic and atraumatic.  Eyes: Conjunctivae and EOM are normal. Right eye exhibits no discharge. Left eye exhibits no discharge. No scleral icterus.  Neck: Normal range of motion. Neck supple.  Cardiovascular: Normal rate and intact distal pulses.   Pulmonary/Chest: Effort normal.  Musculoskeletal: Normal range of motion. He exhibits tenderness. He exhibits no edema or deformity.       Left shoulder: Normal.       Left elbow: Normal.       Left wrist: Normal.       Left forearm: He exhibits tenderness and swelling. He exhibits no edema, no deformity and no laceration.       Left hand: Normal.  Mild swelling and ecchymoses noted to dorsal aspect of left distal forearm with mild tenderness to palpation. Full range of motion of left hand, wrist, forearm, elbow and shoulder. Equal grip strength bilaterally. Sensation grossly intact. 2+ radial pulse. Cap refill less than 2.  Neurological: He is alert and oriented to person, place, and time.  Nursing note and vitals reviewed.   ED Course  Procedures  MDM Patient X-Ray negative for obvious fracture or dislocation. Pain managed in ED. Pt advised to follow up with orthopedics if symptoms persist. Conservative therapy recommended and discussed. Patient will be dc home & is agreeable with above plan. Discussed return precautions.        Satira Sarkicole Elizabeth WarthenNadeau, [place] JerseyPA-C 12/04/16 0226    Layla MawKristen N Ward, DO 12/08/16 0001

## 2016-12-04 NOTE — ED Notes (Signed)
Patient transported to X-ray 

## 2016-12-04 NOTE — ED Triage Notes (Signed)
Pt states L arm caught between 50lbs speaker and table. Pt with obvious swelling to L forearm. 2+ radial pulse, some decreased ROM of L hand, good sensation across hand.

## 2016-12-04 NOTE — Discharge Instructions (Signed)
Take your medications as prescribed. I also recommend resting, elevating and applying ice to your arm for 15-20 minutes 3-4 times daily. Follow up with the orthopedic office listed below if your symptoms have not improved over the next week. Please return to the Emergency Department if symptoms worsen or new onset of fever, redness, swelling, warmth, numbness, tingling, weakness, decreased range of motion.

## 2018-12-01 ENCOUNTER — Encounter (HOSPITAL_COMMUNITY): Payer: Self-pay | Admitting: Emergency Medicine

## 2018-12-01 ENCOUNTER — Emergency Department (HOSPITAL_COMMUNITY)
Admission: EM | Admit: 2018-12-01 | Discharge: 2018-12-02 | Disposition: A | Discharge status: Home / Self Care | Payer: No Typology Code available for payment source | Attending: Emergency Medicine | Admitting: Emergency Medicine

## 2018-12-01 ENCOUNTER — Emergency Department (HOSPITAL_COMMUNITY): Payer: No Typology Code available for payment source

## 2018-12-01 DIAGNOSIS — R55 Syncope and collapse: Secondary | ICD-10-CM | POA: Diagnosis not present

## 2018-12-01 DIAGNOSIS — R42 Dizziness and giddiness: Secondary | ICD-10-CM | POA: Diagnosis not present

## 2018-12-01 DIAGNOSIS — R4182 Altered mental status, unspecified: Secondary | ICD-10-CM | POA: Diagnosis not present

## 2018-12-01 HISTORY — DX: Dizziness and giddiness: R42

## 2018-12-01 LAB — CBC WITH DIFFERENTIAL/PLATELET
ABS IMMATURE GRANULOCYTES: 0.06 10*3/uL (ref 0.00–0.07)
Basophils Absolute: 0.1 10*3/uL (ref 0.0–0.1)
Basophils Relative: 1 %
EOS ABS: 0.1 10*3/uL (ref 0.0–0.5)
Eosinophils Relative: 1 %
HCT: 44.5 % (ref 39.0–52.0)
Hemoglobin: 14.5 g/dL (ref 13.0–17.0)
Immature Granulocytes: 1 %
Lymphocytes Relative: 41 %
Lymphs Abs: 4.2 10*3/uL — ABNORMAL HIGH (ref 0.7–4.0)
MCH: 28.9 pg (ref 26.0–34.0)
MCHC: 32.6 g/dL (ref 30.0–36.0)
MCV: 88.8 fL (ref 80.0–100.0)
Monocytes Absolute: 1 10*3/uL (ref 0.1–1.0)
Monocytes Relative: 9 %
Neutro Abs: 5 10*3/uL (ref 1.7–7.7)
Neutrophils Relative %: 47 %
Platelets: 208 10*3/uL (ref 150–400)
RBC: 5.01 MIL/uL (ref 4.22–5.81)
RDW: 12.8 % (ref 11.5–15.5)
WBC: 10.4 10*3/uL (ref 4.0–10.5)
nRBC: 0 % (ref 0.0–0.2)

## 2018-12-01 LAB — I-STAT CHEM 8, ED
BUN: 20 mg/dL (ref 6–20)
CALCIUM ION: 1.21 mmol/L (ref 1.15–1.40)
Chloride: 106 mmol/L (ref 98–111)
Creatinine, Ser: 1.4 mg/dL — ABNORMAL HIGH (ref 0.61–1.24)
Glucose, Bld: 89 mg/dL (ref 70–99)
HCT: 44 % (ref 39.0–52.0)
Hemoglobin: 15 g/dL (ref 13.0–17.0)
Potassium: 3.8 mmol/L (ref 3.5–5.1)
SODIUM: 141 mmol/L (ref 135–145)
TCO2: 27 mmol/L (ref 22–32)

## 2018-12-01 LAB — HEPATIC FUNCTION PANEL
ALBUMIN: 3.7 g/dL (ref 3.5–5.0)
ALT: 28 U/L (ref 0–44)
AST: 22 U/L (ref 15–41)
Alkaline Phosphatase: 56 U/L (ref 38–126)
Bilirubin, Direct: <0.1 mg/dL (ref 0.0–0.2)
Total Bilirubin: 0.5 mg/dL (ref 0.3–1.2)
Total Protein: 6.6 g/dL (ref 6.5–8.1)

## 2018-12-01 LAB — RAPID URINE DRUG SCREEN, HOSP PERFORMED
Amphetamines: NOT DETECTED
Barbiturates: NOT DETECTED
Benzodiazepines: NOT DETECTED
Cocaine: NOT DETECTED
Opiates: NOT DETECTED
TETRAHYDROCANNABINOL: NOT DETECTED

## 2018-12-01 LAB — ETHANOL: Alcohol, Ethyl (B): <10 mg/dL (ref <10)

## 2018-12-01 LAB — I-STAT TROPONIN, ED: Troponin i, poc: 0 ng/mL (ref 0.00–0.08)

## 2018-12-01 MED ORDER — NALOXONE HCL 2 MG/2ML IJ SOSY
1.0000 mg | PREFILLED_SYRINGE | Freq: Once | INTRAMUSCULAR | Status: AC
  Administered 2018-12-01: 1 mg via INTRAVENOUS
  Filled 2018-12-01: qty 2

## 2018-12-01 MED ORDER — METOCLOPRAMIDE HCL 5 MG/ML IJ SOLN
10.0000 mg | Freq: Once | INTRAMUSCULAR | Status: AC
  Administered 2018-12-01: 10 mg via INTRAVENOUS
  Filled 2018-12-01: qty 2

## 2018-12-01 MED ORDER — MECLIZINE HCL 25 MG PO TABS
25.0000 mg | ORAL_TABLET | Freq: Once | ORAL | Status: AC
  Administered 2018-12-01: 25 mg via ORAL
  Filled 2018-12-01: qty 1

## 2018-12-01 MED ORDER — KETOROLAC TROMETHAMINE 15 MG/ML IJ SOLN
15.0000 mg | Freq: Once | INTRAMUSCULAR | Status: AC
  Administered 2018-12-01: 15 mg via INTRAVENOUS
  Filled 2018-12-01: qty 1

## 2018-12-01 MED ORDER — ONDANSETRON HCL 4 MG/2ML IJ SOLN
4.0000 mg | Freq: Once | INTRAMUSCULAR | Status: AC
  Administered 2018-12-01: 4 mg via INTRAVENOUS
  Filled 2018-12-01: qty 2

## 2018-12-01 NOTE — ED Notes (Signed)
Pt medicated per MD order, pt stating he gets very dizzy if he opens his eyes. C/o 10/10 HA

## 2018-12-01 NOTE — ED Provider Notes (Signed)
[facility] EMERGENCY DEPARTMENT Provider Note   CSN: [id number] Arrival date & time: 12/01/18  1916     History   Chief Complaint No chief complaint on file.   HPI Ricardo Page is a 32 y.o. male.  Patient is a 32 year old previously healthy male who presents for altered mental status with girlfriend.  Girlfriend states that patient had a normal day with her, they went shopping, however he worked overnight and had one energy drink plus a cup of coffee with 4 shots of espresso in it.  After they parted ways, patient started becoming acutely dizzy and called girlfriend on the phone stating that he did not feel well.  Girlfriend came back to pick him up from the parking lot in his truck, patient was responsive but closing his eyes complaining of dizziness/headache and chest pain.  Upon arrival, patient is opening eyes to command mumbling coherent words and answering simple questions.  Patient is not able to articulate what exactly he is feeling, does complain of a headache and chest pain.  No known alcohol use today, no known drug use by girlfriend and friend.  Unknown if patient takes any medications regularly.  Girlfriend does note that patient has been under a lot of stress and working very long hours.  The history is provided by the patient, a relative and a friend. No language interpreter was used.    No past medical history on file.  There are no active problems to display for this patient.  Home Medications    Prior to Admission medications   Not on File    Family History No family history on file.  Social History Social History   Tobacco Use  . Smoking status: Not on file  Substance Use Topics  . Alcohol use: Not on file  . Drug use: Not on file     Allergies   Patient has no allergy information on record.   Review of Systems Review of Systems  Unable to perform ROS: Mental status change     Physical Exam Updated Vital Signs There  were no vitals taken for this visit.  Physical Exam  Constitutional: He is oriented to person, place, and time. He appears well-developed and well-nourished.  HENT:  Head: Normocephalic and atraumatic.  Eyes: Conjunctivae are normal.  Neck: Neck supple.  Cardiovascular: Normal rate and regular rhythm.  No murmur heard. Pulmonary/Chest: Effort normal and breath sounds normal. No respiratory distress.  Abdominal: Soft. There is no tenderness.  Musculoskeletal: He exhibits no edema.  Neurological: He is alert and oriented to person, place, and time. No cranial nerve deficit or sensory deficit. He exhibits normal muscle tone. Coordination normal.  Skin: Skin is warm and dry.  Psychiatric: He has a normal mood and affect.  Nursing note and vitals reviewed.    ED Treatments / Results  Labs (all labs ordered are listed, but only abnormal results are displayed) Labs Reviewed - No data to display  EKG None  Radiology No results found.  Procedures Procedures (including critical care time)  Medications Ordered in ED Medications - No data to display   Initial Impression / Assessment and Plan / ED Course  I have reviewed the triage vital signs and the nursing notes.  Pertinent labs & imaging results that were available during my care of the patient were reviewed by me and considered in my medical decision making (see chart for details).     Patient is a 32 year old male with no  known significant past medical history presenting for altered mental status, complaining of dizziness headache and chest pain.  Upon arrival, patient is able to answer 1-2 word questions and has a normal neurologic exam, although he is keeping his eyes closed.  Patient is alert and oriented.  Vital signs stable. Unknown the etiology as patient denies drug use and alcohol use.  Point-of-care glucose normal.  Will obtain basic labs including ethanol and UDS.  Head CT will be obtained.  Patient be given a liter  of LR bolus.  For concern of benign positional vertigo history and patient is complaining of dizziness, therefore given IV Zofran and meclizine.  Patient also given trial dose of IV Narcan, although patient's pupils are not pinpoint and his respiratory rate is normal. Upon reassessment, patient states that he still has a mild headache and is complaining of some dizziness.  Otherwise he is able to open up eyes and respond appropriately. Unlikely to be SAH, as HCT was within the 6 hour limit.  MRI brain WO will be obtained because patient continues to complain of dizziness without opening his eyes.  Care was transferred at 0000 to Dr. Eudelia Bunchardama to follow up MRI results and ambulate patient.    Final Clinical Impressions(s) / ED Diagnoses   Final diagnoses:  Altered mental status, unspecified altered mental status type    ED Discharge Orders    None       Joaquin CourtsWendel, Len Kluver K, MD 12/01/18 [id number]    Eber HongMiller, Brian, MD 12/03/18 1710

## 2018-12-01 NOTE — ED Provider Notes (Signed)
I saw and evaluated the patient, reviewed the resident's note and I agree with the findings and plan.  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  Pertinent Exam findings: The patient is somnolent, he will open his eyes and move all 4 extremities though he appears diffusely generally weak.  Mucous membranes are moist, pupils are equal round and reactive and his extraocular movements are working appropriately without any nystagmus.  He is able to differentiate left from right to light touch and pinprick.  He does not seem to be bothered when his clothes are removed and he is laying naked on the stretcher prior to being covered with a gown.  To deep painful stimuli he is not moving very much either right leg or left leg but is able to identify it as a pinch rather than a light touch.  There is no facial droop, his speech is not slurred but extremely quiet.  I do not smell any abnormal odors from his body to suggest drug abuse, alcohol etc.  He does not have any rashes on his skin.  He does not have any edema.  He is covered in tattoos many of which deal with music.  It is not clear exactly what is going on, this may be psychiatric, severe hypokalemia, substance related, will get more information from family and friends as they arrive.  His blood sugar is normal, EKG is unremarkable, cardiac monitoring has been  instituted.  IV access was obtained on arrival.  Narcan  I personally interpreted the EKG as well as the resident and agree with the interpretation on the resident's chart.  Final diagnoses:  Altered mental status, unspecified altered mental status type  Dizziness  Syncope, unspecified syncope type      Eber HongMiller, Sergey Ishler, MD 12/03/18 1710

## 2018-12-01 NOTE — ED Notes (Signed)
Pt provided urinal for urine sample.

## 2018-12-01 NOTE — ED Triage Notes (Signed)
Pt arrived POV with friends, to appears near syncopal, pt will slowly answer questions. Pt denies emesis, + nausea.  Pt reports HA, CP. Per girlfriend Lowella Bandyikki at the bedside they spent the day together after leaving [employer] he called her saying something wasn't right, stating if he opened his eyes he may get sick. GF confirms no knowledge of etoh or drugs.

## 2018-12-01 NOTE — ED Notes (Signed)
No change in LOC after Narcan.  Pt able to answer questions and give info.

## 2018-12-02 ENCOUNTER — Encounter (HOSPITAL_COMMUNITY): Payer: Self-pay

## 2018-12-02 NOTE — ED Notes (Signed)
Pt sitting on the edge of the bed, scrub pants provided, pt to have an trial ambulation

## 2018-12-02 NOTE — ED Provider Notes (Signed)
I assumed care of this patient from Drs. Caro HightWendel and Morgan StanleyMiller at 0000.  Please see their note for further details of Hx, PE.  Briefly patient is a 32 y.o. male who presented with lightheadedness, faintness, altered mental status in the setting of sleeper perforation and high doses of caffeine use.  Work-up thus far has been unremarkable.  Plan for MRI.   Current plan is to follow-up MRI and reassess patient.  MRI negative.  Patient has improved and now ambulates without complication.  The patient appears reasonably screened and/or stabilized for discharge and I doubt any other medical condition or other [facility] requiring further screening, evaluation, or treatment in the ED at this time prior to discharge.  The patient is safe for discharge with strict return precautions.  Disposition: Discharge  Condition: Good  I have discussed the results, Dx and Tx plan with the patient who expressed understanding and agree(s) with the plan. Discharge instructions discussed at great length. The patient was given strict return precautions who verbalized understanding of the instructions. No further questions at time of discharge.    ED Discharge Orders    None       Follow Up: Primary care provider  Schedule an appointment as soon as possible for a visit  As needed      Aadon Gorelik, Amadeo GarnetPedro Eduardo, MD 12/02/18 [phone]

## 2019-05-19 ENCOUNTER — Emergency Department (HOSPITAL_BASED_OUTPATIENT_CLINIC_OR_DEPARTMENT_OTHER): Payer: Self-pay

## 2019-05-19 ENCOUNTER — Emergency Department (HOSPITAL_BASED_OUTPATIENT_CLINIC_OR_DEPARTMENT_OTHER)
Admission: EM | Admit: 2019-05-19 | Discharge: 2019-05-19 | Disposition: A | Discharge status: Home / Self Care | Payer: Self-pay | Attending: Emergency Medicine | Admitting: Emergency Medicine

## 2019-05-19 ENCOUNTER — Other Ambulatory Visit: Payer: Self-pay

## 2019-05-19 ENCOUNTER — Encounter (HOSPITAL_BASED_OUTPATIENT_CLINIC_OR_DEPARTMENT_OTHER): Payer: Self-pay | Admitting: Emergency Medicine

## 2019-05-19 DIAGNOSIS — Y9289 Other specified places as the place of occurrence of the external cause: Secondary | ICD-10-CM | POA: Insufficient documentation

## 2019-05-19 DIAGNOSIS — F17200 Nicotine dependence, unspecified, uncomplicated: Secondary | ICD-10-CM | POA: Insufficient documentation

## 2019-05-19 DIAGNOSIS — W268XXA Contact with other sharp object(s), not elsewhere classified, initial encounter: Secondary | ICD-10-CM | POA: Insufficient documentation

## 2019-05-19 DIAGNOSIS — Y93H2 Activity, gardening and landscaping: Secondary | ICD-10-CM | POA: Insufficient documentation

## 2019-05-19 DIAGNOSIS — S81812A Laceration without foreign body, left lower leg, initial encounter: Secondary | ICD-10-CM | POA: Insufficient documentation

## 2019-05-19 DIAGNOSIS — Y990 Civilian activity done for income or pay: Secondary | ICD-10-CM | POA: Insufficient documentation

## 2019-05-19 MED ORDER — BACITRACIN ZINC 500 UNIT/GM EX OINT
TOPICAL_OINTMENT | Freq: Once | CUTANEOUS | Status: AC
  Administered 2019-05-19: 1 via TOPICAL
  Filled 2019-05-19: qty 28.35

## 2019-05-19 NOTE — ED Provider Notes (Signed)
Medical screening examination/treatment/procedure(s) were conducted as a shared visit with non-physician practitioner(s) and myself.  I personally evaluated the patient during the encounter.  None    Vanetta Mulders, MD 05/19/19 1044

## 2019-05-19 NOTE — ED Provider Notes (Addendum)
[facility] EMERGENCY DEPARTMENT Provider Note   CSN: [id number] Arrival date & time: 05/19/19  1007    History   Chief Complaint Chief Complaint  Patient presents with  . Leg Injury    HPI Ricardo Page is a 33 y.o. male.     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  When something flew up and cut his leg.  It came out it fair amount of force.  Not sure what it is.  Has a laceration to the front part of his left leg.  Patient states tetanus is up-to-date.  Past medical history noncontributory.  Patient without history of fevers or upper respiratory infection.     Past Medical History:  Diagnosis Date  . Vertigo     There are no active problems to display for this patient.   Past Surgical History:  Procedure Laterality Date  . APPENDECTOMY          Home Medications    Prior to Admission medications   Medication Sig Start Date End Date Taking? Authorizing Provider  azithromycin (ZITHROMAX Z-PAK) 250 MG tablet Take two tablets on day one, then one tab qd days 2-5 Patient not taking: Reported on 08/07/2016 01/10/14   Triplett, Tammy, PA-C  cyclobenzaprine (FLEXERIL) 5 MG tablet Take 1 tablet (5 mg total) by mouth 2 (two) times daily as needed. Patient not taking: Reported on 08/07/2016 05/27/15   Teressa Lower, NP  HYDROcodone-acetaminophen (NORCO/VICODIN) 5-325 MG per tablet Take 1 tablet by mouth every 4 (four) hours as needed. Patient not taking: Reported on 08/07/2016 05/26/13   Janne Napoleon, NP  HYDROcodone-acetaminophen (NORCO/VICODIN) 5-325 MG tablet Take 1 tablet by mouth every 4 (four) hours as needed for severe pain. 08/07/16   Sam, Ace Gins, PA-C  ibuprofen (ADVIL,MOTRIN) 200 MG tablet Take 200 mg by mouth every 6 (six) hours as needed for mild pain.    [provider]  ibuprofen (ADVIL,MOTRIN) 800 MG tablet Take 1 tablet (800 mg total) by mouth 3 (three) times daily. Patient not taking: Reported on 08/07/2016 05/27/15   Teressa Lower, NP  naproxen (NAPROSYN) 500 MG tablet Take 1 tablet (500 mg total) by mouth 2 (two) times daily. 12/04/16   Barrett Henle, PA-C    Family History No family history on file.  Social History Social History   Tobacco Use  . Smoking status: Current Every Day Smoker  . Smokeless tobacco: Never Used  Substance Use Topics  . Alcohol use: Yes  . Drug use: Not Currently     Allergies   Patient has no known allergies.   Review of Systems Review of Systems  Constitutional: Negative for chills and fever.  HENT: Negative for congestion, rhinorrhea and sore throat.   Eyes: Negative for visual disturbance.  Respiratory: Negative for cough and shortness of breath.   Cardiovascular: Negative for chest pain and leg swelling.  Gastrointestinal: Negative for abdominal pain, diarrhea, nausea and vomiting.  Genitourinary: Negative for dysuria.  Musculoskeletal: Negative for back pain and neck pain.  Skin: Positive for wound. Negative for rash.  Neurological: Negative for dizziness, weakness, light-headedness, numbness and headaches.  Hematological: Does not bruise/bleed easily.  Psychiatric/Behavioral: Negative for confusion.     Physical Exam Updated Vital Signs BP 133/74 (BP Location: Right Arm)   Pulse 97   Temp 98.1 F (36.7 C) (Oral)   Resp 16   Ht 1.854 m (6\' 1" )   Wt 128.8 kg   SpO2 99%  BMI 37.47 kg/m   Physical Exam Vitals signs and nursing note reviewed.  Constitutional:      Appearance: He is well-developed.  HENT:     Head: Normocephalic and atraumatic.  Eyes:     Extraocular Movements: Extraocular movements intact.     Conjunctiva/sclera: Conjunctivae normal.  Neck:     Musculoskeletal: Neck supple.  Cardiovascular:     Rate and Rhythm: Normal rate and regular rhythm.     Heart sounds: No murmur.  Pulmonary:     Effort: Pulmonary effort is normal. No respiratory distress.     Breath sounds: Normal breath sounds.  Abdominal:      Palpations: Abdomen is soft.     Tenderness: There is no abdominal tenderness.  Musculoskeletal:        General: Signs of injury present.     Comments: 3.5 cm laceration to distal one third of the shin.  Anteriorly deep through the dermis bone exposed.  No significant bleeding.  No evidence of foreign body.  Distally dorsalis pedis pulse 2+.  Good range of motion of toes.  Sensation intact.  No other injury to the extremities.  Skin:    General: Skin is warm and dry.     Capillary Refill: Capillary refill takes less than 2 seconds.  Neurological:     General: No focal deficit present.     Mental Status: He is alert and oriented to person, place, and time.     Sensory: No sensory deficit.     Motor: No weakness.      ED Treatments / Results  Labs (all labs ordered are listed, but only abnormal results are displayed) Labs Reviewed - No data to display  EKG None  Radiology Dg Tibia/fibula Left  Result Date: 05/19/2019 CLINICAL DATA:  Injury laceration from lawnmower. EXAM: LEFT TIBIA AND FIBULA - 2 VIEW COMPARISON:  None. FINDINGS: Osseous alignment is normal. No fracture line or cortical defect. No radiopaque foreign body appreciated within the surrounding soft tissues. IMPRESSION: 1. Negative. 2. No radiopaque foreign body. Electronically Signed   By: Bary RichardStan  Maynard M.D.   On: 05/19/2019 10:01    Procedures Procedures (including critical care time)  Medications Ordered in ED Medications - No data to display   Initial Impression / Assessment and Plan / ED Course  I have reviewed the triage vital signs and the nursing notes.  Pertinent labs & imaging results that were available during my care of the patient were reviewed by me and considered in my medical decision making (see chart for details).       X-rays of the left leg showed no evidence of foreign body or bony injury.  Wound will need to be washed out thoroughly.  And can be closed either by staples or sutures.   Patient states tetanus is up-to-date.  Wound repair as per physician assistant.   Final Clinical Impressions(s) / ED Diagnoses   Final diagnoses:  Laceration of left lower extremity, initial encounter    ED Discharge Orders    None       Vanetta MuldersZackowski, Debborah Alonge, MD 05/19/19 1020    Vanetta MuldersZackowski, Hassell Patras, MD 05/19/19 1102

## 2019-05-19 NOTE — ED Triage Notes (Signed)
Pt reports that he was mowing and something flew up and cut his leg. Laceration noted to L shin.

## 2019-05-19 NOTE — ED Provider Notes (Signed)
..  Laceration Repair Date/Time: 05/19/2019 10:55 AM Performed by: Dartha Lodge, PA-C Authorized by: Dartha Lodge, PA-C   Consent:    Consent obtained:  Verbal   Risks discussed:  Pain, infection, poor cosmetic result, poor wound healing and need for additional repair   Alternatives discussed:  No treatment Anesthesia (see MAR for exact dosages):    Anesthesia method:  Local infiltration   Local anesthetic:  Lidocaine 1% w/o epi Laceration details:    Location:  Leg   Length (cm):  3.5   Depth (mm):  10 Repair type:    Repair type:  Intermediate Pre-procedure details:    Preparation:  Patient was prepped and draped in usual sterile fashion and imaging obtained to evaluate for foreign bodies Exploration:    Hemostasis achieved with:  Direct pressure   Wound exploration: wound explored through full range of motion and entire depth of wound probed and visualized     Wound extent: areolar tissue violated     Wound extent: no foreign bodies/material noted and no underlying fracture noted   Treatment:    Area cleansed with:  Saline   Amount of cleaning:  Extensive   Irrigation solution:  Sterile saline   Irrigation volume:  500   Irrigation method:  Syringe Skin repair:    Repair method:  Sutures   Suture size:  4-0   Suture material:  Prolene (vicryl rapide for deep sutures)   Number of sutures:  9 (3 deep, 6 superficial) Approximation:    Approximation:  Close Post-procedure details:    Dressing:  Antibiotic ointment and bulky dressing   Patient tolerance of procedure:  Tolerated well, no immediate complications      Legrand Rams 05/19/19 1058    Vanetta Mulders, MD 05/20/19 [phone]

## 2019-05-19 NOTE — Discharge Instructions (Addendum)
Keep dressing in place.  Can remove it after 2 days.  Keep wound dry.  Suture removal 10 days.  Return for any new or worse symptoms.

## 2019-05-19 NOTE — ED Notes (Signed)
Pt verbalized understanding of dc instructions.

## 2019-09-16 ENCOUNTER — Encounter (HOSPITAL_COMMUNITY): Payer: Self-pay | Admitting: Emergency Medicine

## 2019-09-16 ENCOUNTER — Other Ambulatory Visit: Payer: Self-pay

## 2019-09-16 ENCOUNTER — Emergency Department (HOSPITAL_COMMUNITY)
Admission: EM | Admit: 2019-09-16 | Discharge: 2019-09-16 | Disposition: A | Discharge status: Home / Self Care | Payer: BC Managed Care – PPO | Attending: Emergency Medicine | Admitting: Emergency Medicine

## 2019-09-16 DIAGNOSIS — M79672 Pain in left foot: Secondary | ICD-10-CM | POA: Diagnosis present

## 2019-09-16 DIAGNOSIS — F17200 Nicotine dependence, unspecified, uncomplicated: Secondary | ICD-10-CM | POA: Diagnosis not present

## 2019-09-16 NOTE — ED Provider Notes (Signed)
[facility] EMERGENCY DEPARTMENT Provider Note   CSN: [id number] Arrival date & time: 09/16/19  2004     History   Chief Complaint Chief Complaint  Patient presents with  . Foot Pain    HPI Ricardo Page is a 33 y.o. male.     33 yo M with a chief complaint of left heel pain.  Going on for the past week or so.  No overt trauma.  Feels sharp pain to his heel that radiates up to the Achilles.  Worse with bearing weight.  Patient has some remote left ankle injuries due to sports.  He has been walking more on the ball of his foot which is caused him to have more pain to his calf.  The history is provided by the patient.  Foot Pain This is a new problem. The current episode started 2 days ago. The problem occurs constantly. The problem has been gradually worsening. Pertinent negatives include no chest pain, no abdominal pain, no headaches and no shortness of breath. The symptoms are aggravated by bending and walking. Nothing relieves the symptoms. He has tried nothing for the symptoms. The treatment provided no relief.    Past Medical History:  Diagnosis Date  . Vertigo     There are no active problems to display for this patient.   Past Surgical History:  Procedure Laterality Date  . APPENDECTOMY          Home Medications    Prior to Admission medications   Medication Sig Start Date End Date Taking? Authorizing Provider  azithromycin (ZITHROMAX Z-PAK) 250 MG tablet Take two tablets on day one, then one tab qd days 2-5 Patient not taking: Reported on 08/07/2016 01/10/14   Triplett, Tammy, PA-C  cyclobenzaprine (FLEXERIL) 5 MG tablet Take 1 tablet (5 mg total) by mouth 2 (two) times daily as needed. Patient not taking: Reported on 08/07/2016 05/27/15   Teressa Lower, NP  HYDROcodone-acetaminophen (NORCO/VICODIN) 5-325 MG per tablet Take 1 tablet by mouth every 4 (four) hours as needed. Patient not taking: Reported on 08/07/2016 05/26/13   Janne Napoleon,  NP  HYDROcodone-acetaminophen (NORCO/VICODIN) 5-325 MG tablet Take 1 tablet by mouth every 4 (four) hours as needed for severe pain. 08/07/16   Sam, Ace Gins, PA-C  ibuprofen (ADVIL,MOTRIN) 200 MG tablet Take 200 mg by mouth every 6 (six) hours as needed for mild pain.    [provider]  ibuprofen (ADVIL,MOTRIN) 800 MG tablet Take 1 tablet (800 mg total) by mouth 3 (three) times daily. Patient not taking: Reported on 08/07/2016 05/27/15   Teressa Lower, NP  naproxen (NAPROSYN) 500 MG tablet Take 1 tablet (500 mg total) by mouth 2 (two) times daily. 12/04/16   Barrett Henle, PA-C    Family History No family history on file.  Social History Social History   Tobacco Use  . Smoking status: Current Every Day Smoker  . Smokeless tobacco: Never Used  Substance Use Topics  . Alcohol use: Yes  . Drug use: Not Currently     Allergies   Patient has no known allergies.   Review of Systems Review of Systems  Constitutional: Negative for chills and fever.  HENT: Negative for congestion and facial swelling.   Eyes: Negative for discharge and visual disturbance.  Respiratory: Negative for shortness of breath.   Cardiovascular: Negative for chest pain and palpitations.  Gastrointestinal: Negative for abdominal pain, diarrhea and vomiting.  Musculoskeletal: Positive for myalgias. Negative for arthralgias.  Skin:  Negative for color change and rash.  Neurological: Negative for tremors, syncope and headaches.  Psychiatric/Behavioral: Negative for confusion and dysphoric mood.     Physical Exam Updated Vital Signs BP (!) 149/80   Pulse 89   Temp 98 F (36.7 C) (Oral)   Resp 18   SpO2 98%   Physical Exam Vitals signs and nursing note reviewed.  Constitutional:      Appearance: He is well-developed.  HENT:     Head: Normocephalic and atraumatic.  Eyes:     Pupils: Pupils are equal, round, and reactive to light.  Neck:     Musculoskeletal: Normal range of  motion and neck supple.     Vascular: No JVD.  Cardiovascular:     Rate and Rhythm: Normal rate and regular rhythm.     Heart sounds: No murmur. No friction rub. No gallop.   Pulmonary:     Effort: No respiratory distress.     Breath sounds: No wheezing.  Abdominal:     General: There is no distension.     Tenderness: There is no guarding or rebound.  Musculoskeletal: Normal range of motion.        General: Tenderness present.     Comments: Tenderness to the heel, worst to the attachment of the achilles to the calcaneus.  No significant edema.  Pulse motor and sensation are intact distally.  Ligaments to the ankle appear to be intact and equal to the other side.  Thompson test with intact Achilles.  Skin:    Coloration: Skin is not pale.     Findings: No rash.  Neurological:     Mental Status: He is alert and oriented to person, place, and time.  Psychiatric:        Behavior: Behavior normal.      ED Treatments / Results  Labs (all labs ordered are listed, but only abnormal results are displayed) Labs Reviewed - No data to display  EKG None  Radiology No results found.  Procedures Procedures (including critical care time)  Medications Ordered in ED Medications - No data to display   Initial Impression / Assessment and Plan / ED Course  I have reviewed the triage vital signs and the nursing notes.  Pertinent labs & imaging results that were available during my care of the patient were reviewed by me and considered in my medical decision making (see chart for details).        33 yo M with a chief complaints of heel pain.  Patient has either Achilles tendinitis or components of a heel bruise or plantar fasciitis.  We will have him not bear weight for the next week.  Tylenol and ibuprofen.  Splint for comfort.  Patient has seen [facility] in the past.  Will rerefer him there.  11:37 PM:  I have discussed the diagnosis/risks/treatment options with the patient  and believe the pt to be eligible for discharge home to follow-up with Ortho. We also discussed returning to the ED immediately if new or worsening sx occur. We discussed the sx which are most concerning (e.g., sudden worsening pain, fever, inability to tolerate by mouth) that necessitate immediate return. Medications administered to the patient during their visit and any new prescriptions provided to the patient are listed below.  Medications given during this visit Medications - No data to display   The patient appears reasonably screen and/or stabilized for discharge and I doubt any other medical condition or other [facility] requiring further screening, evaluation, or  treatment in the ED at this time prior to discharge.    Final Clinical Impressions(s) / ED Diagnoses   Final diagnoses:  Pain of left heel    ED Discharge Orders    None       Deno Etienne, DO 09/16/19 2337

## 2019-09-16 NOTE — Discharge Instructions (Signed)
Take 4 over the counter ibuprofen tablets 3 times a day or 2 over-the-counter naproxen tablets twice a day for pain. Also take tylenol 1000mg (2 extra strength) four times a day.   Try to keep your weight off of this for about a week.  Follow-up with orthopedics in the office as needed.

## 2019-09-16 NOTE — ED Triage Notes (Addendum)
Pt reports ongoing L heel pain that has gotten worse over the past few days, states that his heel and ankle are very tender to the touch and pain radiates up his leg. Reports some bruising and swelling to the side of his foot, states that when foot is in dependent position, he has some tingling. No known injuries. Denies any recent long trips.

## 2019-09-17 NOTE — Progress Notes (Signed)
Orthopedic Tech Progress Note Patient Details:  Ricardo Page 1986-05-27 [id number]  Ortho Devices Type of Ortho Device: ASO, Crutches Ortho Device/Splint Location: lle Ortho Device/Splint Interventions: Ordered, Application, Adjustment   Post Interventions Patient Tolerated: Well Instructions Provided: Care of device, Adjustment of device   Karolee Stamps 09/17/2019, 12:45 AM

## 2021-07-07 ENCOUNTER — Emergency Department (HOSPITAL_COMMUNITY)
Admission: EM | Admit: 2021-07-07 | Discharge: 2021-07-08 | Disposition: A | Discharge status: Home / Self Care | Payer: BC Managed Care – PPO | Attending: Emergency Medicine | Admitting: Emergency Medicine

## 2021-07-07 DIAGNOSIS — S46911A Strain of unspecified muscle, fascia and tendon at shoulder and upper arm level, right arm, initial encounter: Secondary | ICD-10-CM | POA: Diagnosis not present

## 2021-07-07 DIAGNOSIS — F17200 Nicotine dependence, unspecified, uncomplicated: Secondary | ICD-10-CM | POA: Diagnosis not present

## 2021-07-07 DIAGNOSIS — S4991XA Unspecified injury of right shoulder and upper arm, initial encounter: Secondary | ICD-10-CM | POA: Diagnosis present

## 2021-07-07 DIAGNOSIS — X500XXA Overexertion from strenuous movement or load, initial encounter: Secondary | ICD-10-CM | POA: Diagnosis not present

## 2021-07-07 NOTE — ED Triage Notes (Signed)
Pt moving/ lifted farm equipment, "tweaked" shoulder. States heard pop, unsure if dislocated. Limited ROM, sensation, cap refill intact distal to injury.

## 2021-07-08 ENCOUNTER — Emergency Department (HOSPITAL_COMMUNITY): Payer: BC Managed Care – PPO

## 2021-07-08 MED ORDER — CYCLOBENZAPRINE HCL 5 MG PO TABS: 5.0000 mg | ORAL_TABLET | Freq: Two times a day (BID) | ORAL | 0 refills | Status: AC | PRN

## 2021-07-08 MED ORDER — IBUPROFEN 800 MG PO TABS
800.0000 mg | ORAL_TABLET | Freq: Once | ORAL | Status: AC
  Administered 2021-07-08: 800 mg via ORAL
  Filled 2021-07-08: qty 1

## 2021-07-08 MED ORDER — IBUPROFEN 800 MG PO TABS: 800.0000 mg | ORAL_TABLET | Freq: Three times a day (TID) | ORAL | 0 refills | Status: AC

## 2021-07-08 NOTE — ED Provider Notes (Signed)
[facility] EMERGENCY DEPARTMENT Provider Note   CSN: [id number] Arrival date & time: 07/07/21  2317     History Chief Complaint  Patient presents with   Shoulder Injury    Ricardo Page is a 35 y.o. male.  The history is provided by the patient. No language interpreter was used.  Shoulder Injury    35 year old male presenting c/o L shoulder pain.  Pt report earlier today he was lifting farm equipment when he has to hyperextend his right short arm to grab an equipment from falling he felt pain about the shoulder.  Pain is sharp shooting worse with movement and is moderate in severity.  He felt something popped.  He tries going home and taking shower taking over-the-counter Tylenol and ibuprofen without adequate relief.  He endorsed some tightness down his arm but denies any numbness.  Denies any neck pain or chest pain.  He is right arm dominant.  Past Medical History:  Diagnosis Date   Vertigo     There are no problems to display for this patient.   Past Surgical History:  Procedure Laterality Date   APPENDECTOMY         No family history on file.  Social History   Tobacco Use   Smoking status: Every Day   Smokeless tobacco: Never  Vaping Use   Vaping Use: Never used  Substance Use Topics   Alcohol use: Yes   Drug use: Not Currently    Home Medications Prior to Admission medications   Medication Sig Start Date End Date Taking? Authorizing Provider  azithromycin (ZITHROMAX Z-PAK) 250 MG tablet Take two tablets on day one, then one tab qd days 2-5 Patient not taking: Reported on 08/07/2016 01/10/14   Triplett, Tammy, PA-C  cyclobenzaprine (FLEXERIL) 5 MG tablet Take 1 tablet (5 mg total) by mouth 2 (two) times daily as needed. Patient not taking: Reported on 08/07/2016 05/27/15   Teressa Lower, NP  HYDROcodone-acetaminophen (NORCO/VICODIN) 5-325 MG per tablet Take 1 tablet by mouth every 4 (four) hours as needed. Patient not taking:  Reported on 08/07/2016 05/26/13   Janne Napoleon, NP  HYDROcodone-acetaminophen (NORCO/VICODIN) 5-325 MG tablet Take 1 tablet by mouth every 4 (four) hours as needed for severe pain. 08/07/16   Sam, Ace Gins, PA-C  ibuprofen (ADVIL,MOTRIN) 200 MG tablet Take 200 mg by mouth every 6 (six) hours as needed for mild pain.    [provider]  ibuprofen (ADVIL,MOTRIN) 800 MG tablet Take 1 tablet (800 mg total) by mouth 3 (three) times daily. Patient not taking: Reported on 08/07/2016 05/27/15   Teressa Lower, NP  naproxen (NAPROSYN) 500 MG tablet Take 1 tablet (500 mg total) by mouth 2 (two) times daily. 12/04/16   Barrett Henle, PA-C    Allergies    Patient has no known allergies.  Review of Systems   Review of Systems  Constitutional:  Negative for fever.  Musculoskeletal:  Positive for arthralgias.  Skin:  Negative for wound.  Neurological:  Negative for numbness.   Physical Exam Updated Vital Signs BP 132/76 (BP Location: Left Arm)   Pulse 84   Temp 98.2 F (36.8 C) (Oral)   Resp 16   SpO2 98%   Physical Exam Vitals and nursing note reviewed.  Constitutional:      General: He is not in acute distress.    Appearance: He is well-developed.  HENT:     Head: Atraumatic.  Eyes:     Conjunctiva/sclera:  Conjunctivae normal.  Musculoskeletal:        General: Tenderness (Right shoulder: And tenderness along the deltoid and axillary region with decreased shoulder abduction abduction and rotation with active range of motion.  Normal passive range of motion) present.     Cervical back: Neck supple.     Comments: Right elbow right wrist nontender, radial pulse 2+.  Normal grip strength   Skin:    Findings: No rash.  Neurological:     Mental Status: He is alert.    ED Results / Procedures / Treatments   Labs (all labs ordered are listed, but only abnormal results are displayed) Labs Reviewed - No data to display  EKG None  Radiology DG Shoulder Right  Result  Date: 07/08/2021 CLINICAL DATA:  Shoulder pain following heavy lifting, initial encounter EXAM: RIGHT SHOULDER - 2+ VIEW COMPARISON:  None. FINDINGS: No acute fracture or dislocation is noted. No soft tissue abnormality is seen. Underlying bony thorax is within normal limits. IMPRESSION: No acute abnormality noted. Electronically Signed   By: Alcide Clever M.D.   On: 07/08/2021 00:56    Procedures Procedures   Medications Ordered in ED Medications - No data to display  ED Course  I have reviewed the triage vital signs and the nursing notes.  Pertinent labs & imaging results that were available during my care of the patient were reviewed by me and considered in my medical decision making (see chart for details).    MDM Rules/Calculators/A&P                          BP 132/76 (BP Location: Left Arm)   Pulse 84   Temp 98.2 F (36.8 C) (Oral)   Resp 16   SpO2 98%   Final Clinical Impression(s) / ED Diagnoses Final diagnoses:  Strain of right shoulder, initial encounter    Rx / DC Orders ED Discharge Orders          Ordered    cyclobenzaprine (FLEXERIL) 5 MG tablet  2 times daily PRN        07/08/21 0234    ibuprofen (ADVIL) 800 MG tablet  3 times daily        07/08/21 0234           2:32 AM Patient hyperextended his right shoulder while grabbing a farm equipment from having it falling.  He report pain to his right shoulder.  It is likely muscle strain have a rotator cuff injury is a possibility.  X-ray of the right shoulder unremarkable.  RICE therapy discussed.  Sling provided with appropriate recommendation and usage.  Orthopedic referral given as needed.  He is neurovascular intact.   Fayrene Helper, PA-C 07/08/21 0236    Gilda Crease, MD 07/08/21 [phone]

## 2021-07-08 NOTE — Discharge Instructions (Addendum)
You have been evaluated for your right shoulder injury.  This is likely a strain or sprain of the shoulder.  X-ray did not show any broken bone or shoulder dislocation.  Wear sling as needed but do not wear it for more than 3 to 4 hours at a time to prevent frozen shoulder.  Take ibuprofen and Flexeril as needed for pain.  If your symptoms persist for more than a week consider follow-up with orthopedist for further care.

## 2022-06-07 ENCOUNTER — Emergency Department (HOSPITAL_BASED_OUTPATIENT_CLINIC_OR_DEPARTMENT_OTHER)
Admission: EM | Admit: 2022-06-07 | Discharge: 2022-06-07 | Disposition: A | Discharge status: Home / Self Care | Payer: Self-pay | Attending: Emergency Medicine | Admitting: Emergency Medicine

## 2022-06-07 ENCOUNTER — Encounter (HOSPITAL_BASED_OUTPATIENT_CLINIC_OR_DEPARTMENT_OTHER): Payer: Self-pay

## 2022-06-07 ENCOUNTER — Emergency Department (HOSPITAL_BASED_OUTPATIENT_CLINIC_OR_DEPARTMENT_OTHER): Payer: Self-pay | Admitting: Radiology

## 2022-06-07 DIAGNOSIS — S90911A Unspecified superficial injury of right ankle, initial encounter: Secondary | ICD-10-CM | POA: Insufficient documentation

## 2022-06-07 DIAGNOSIS — Y92213 High school as the place of occurrence of the external cause: Secondary | ICD-10-CM | POA: Insufficient documentation

## 2022-06-07 DIAGNOSIS — S99911A Unspecified injury of right ankle, initial encounter: Secondary | ICD-10-CM

## 2022-06-07 DIAGNOSIS — Y9366 Activity, soccer: Secondary | ICD-10-CM | POA: Insufficient documentation

## 2022-06-07 DIAGNOSIS — W502XXA Accidental twist by another person, initial encounter: Secondary | ICD-10-CM | POA: Insufficient documentation

## 2022-06-07 MED ORDER — IBUPROFEN 800 MG PO TABS
800.0000 mg | ORAL_TABLET | Freq: Once | ORAL | Status: AC
  Administered 2022-06-07: 800 mg via ORAL
  Filled 2022-06-07: qty 1

## 2022-06-07 NOTE — ED Provider Notes (Signed)
[facility] EMERGENCY DEPT Provider Note   CSN: [id number] Arrival date & time: 06/07/22  1856     History  Chief Complaint  Patient presents with   Ankle Pain    Ricardo Page is a 36 y.o. male presenting to ED with right ankle pain.  Reports that he twisted his ankle today and heard a pop earlier.  He has tried ice with no improvement.  He sees orthopedics in the past for problems with his ankle weakness in his ankle which dates back to his soccer days in high school  HPI     Home Medications Prior to Admission medications   Medication Sig Start Date End Date Taking? Authorizing Provider  cyclobenzaprine (FLEXERIL) 5 MG tablet Take 1 tablet (5 mg total) by mouth 2 (two) times daily as needed. 07/08/21   Fayrene Helper, PA-C  ibuprofen (ADVIL) 800 MG tablet Take 1 tablet (800 mg total) by mouth 3 (three) times daily. 07/08/21   Fayrene Helper, PA-C  naproxen (NAPROSYN) 500 MG tablet Take 1 tablet (500 mg total) by mouth 2 (two) times daily. 12/04/16   Barrett Henle, PA-C      Allergies    Patient has no known allergies.    Review of Systems   Review of Systems  Physical Exam Updated Vital Signs BP 124/77 (BP Location: Right Arm)   Pulse 99   Temp 98 F (36.7 C) (Oral)   Resp 18   Ht 6' (1.829 m)   Wt (!) 140.6 kg   SpO2 100%   BMI 42.04 kg/m  Physical Exam Constitutional:      General: He is not in acute distress. HENT:     Head: Normocephalic and atraumatic.  Eyes:     Conjunctiva/sclera: Conjunctivae normal.     Pupils: Pupils are equal, round, and reactive to light.  Cardiovascular:     Rate and Rhythm: Normal rate and regular rhythm.  Pulmonary:     Effort: Pulmonary effort is normal. No respiratory distress.  Musculoskeletal:     Comments: Diffuse swelling of the right ankle, tenderness of the anterior talofibular ligament, mild edema of the ankle joint, patient is able to dorsiflex the ankle, and the Achilles tendon appears roughly  intact  Skin:    General: Skin is warm and dry.  Neurological:     General: No focal deficit present.     Mental Status: He is alert. Mental status is at baseline.  Psychiatric:        Mood and Affect: Mood normal.        Behavior: Behavior normal.     ED Results / Procedures / Treatments   Labs (all labs ordered are listed, but only abnormal results are displayed) Labs Reviewed - No data to display  EKG None  Radiology DG Ankle Complete Right  Result Date: 06/07/2022 CLINICAL DATA:  Twisting injury to right ankle, heard a pop leading to fall. EXAM: RIGHT ANKLE - COMPLETE 3+ VIEW COMPARISON:  None Available. FINDINGS: There is no evidence of fracture, dislocation, or joint effusion. The ankle mortise is preserved. There is a small plantar calcaneal spur. There is no evidence of arthropathy or other focal bone abnormality. Mild soft tissue edema. IMPRESSION: Mild soft tissue edema. No fracture or dislocation. Electronically Signed   By: Narda Rutherford M.D.   On: 06/07/2022 19:34    Procedures Procedures    Medications Ordered in ED Medications  ibuprofen (ADVIL) tablet 800 mg (has no administration in time range)  ED Course/ Medical Decision Making/ A&P                           Medical Decision Making Amount and/or Complexity of Data Reviewed Radiology: ordered.  Risk Prescription drug management.   Isolated right ankle injury, x-rays reviewed personally, no acute fracture noted.  He is neurovascularly intact.  I suspect this may be a high-grade ankle sprain.  We will place the patient in cam boot, crutches as needed, weightbearing as tolerated.  Motrin and ibuprofen at home.  We will follow-up with orthopedics        Final Clinical Impression(s) / ED Diagnoses Final diagnoses:  Injury of right ankle, initial encounter    Rx / DC Orders ED Discharge Orders     None         Jai Steil, Kermit Balo, MD 06/07/22 2305

## 2022-06-07 NOTE — ED Notes (Signed)
Pt refused crutches.

## 2022-06-07 NOTE — Discharge Instructions (Addendum)
Please call to schedule follow-up appointment the orthopedic physicians this week in the office.  You should continue elevating your leg, applying ice, taking ibuprofen and Tylenol over-the-counter as needed for pain and swelling.  When you walk you should use the cam boot for at least 7 days, to provide some extra stability to your ankle.

## 2022-06-07 NOTE — ED Triage Notes (Signed)
Pt reports on green way flat surface and twisting his ankle.  "Heard it pop" has iced it since 3 pm and swelling has not gone down.   9/10 pain

## 2023-11-08 IMAGING — DX DG ANKLE COMPLETE 3+V*R*
3 series · 3 of 3 positions shown · non-contrast
Comparison: None Available.

CLINICAL DATA: Twisting injury to right ankle, heard a pop leading
to fall.

EXAM:
RIGHT ANKLE - COMPLETE 3+ VIEW

[ankle ap]
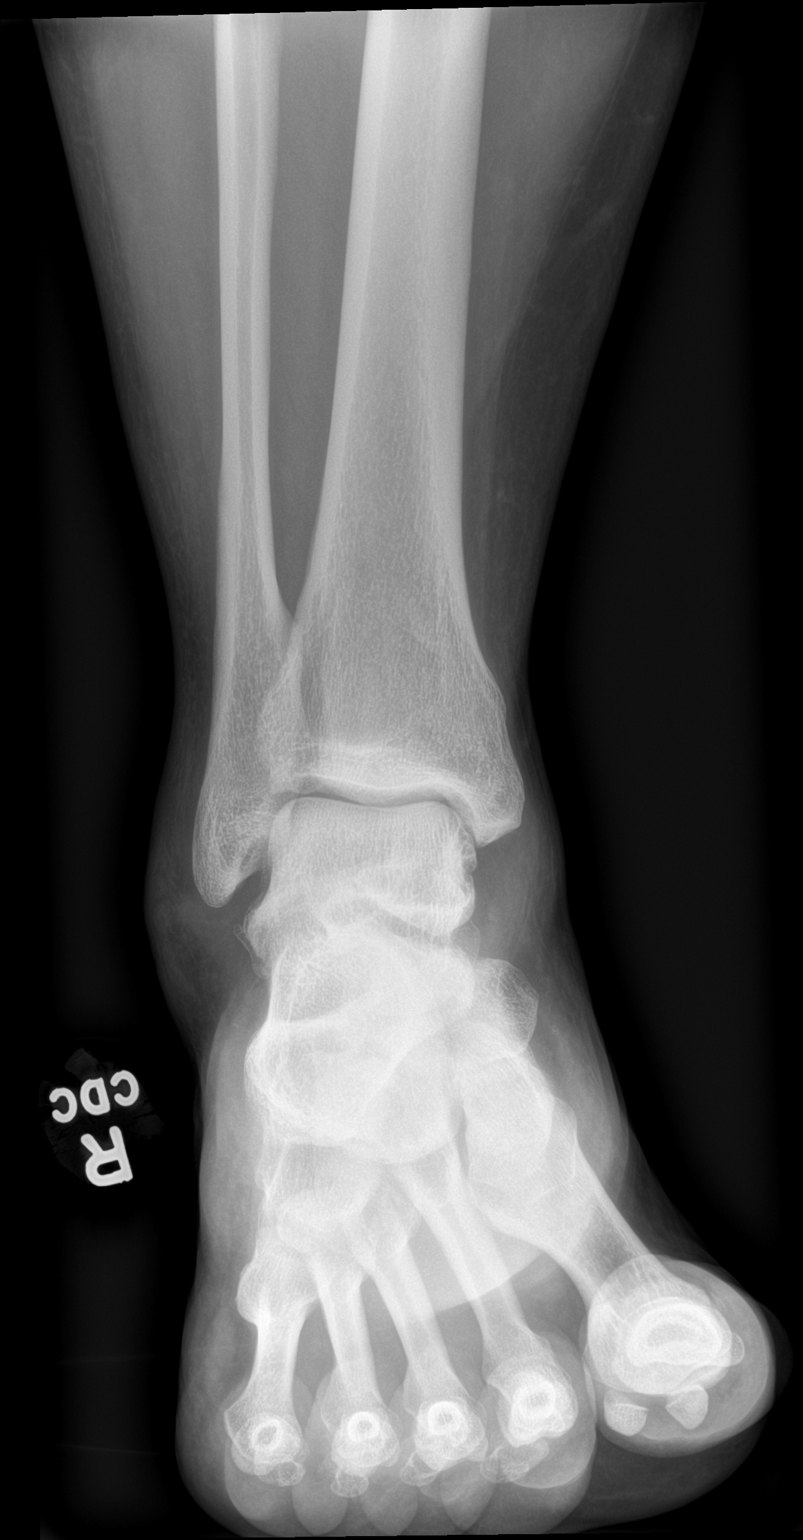

[ankle obl]
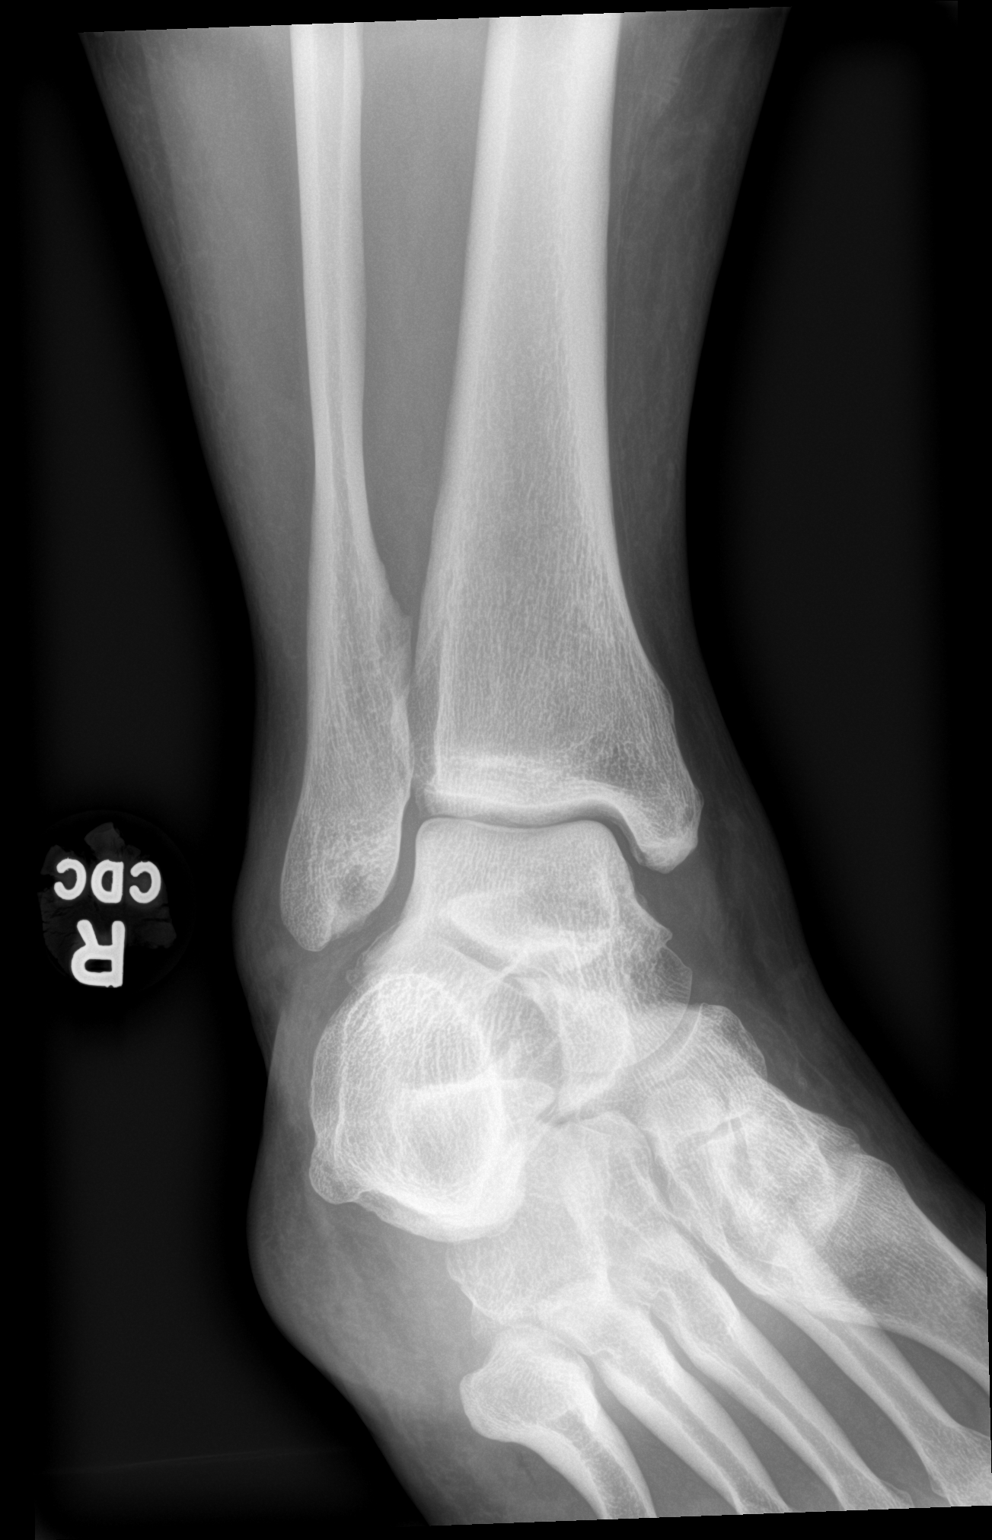

[ankle lat]
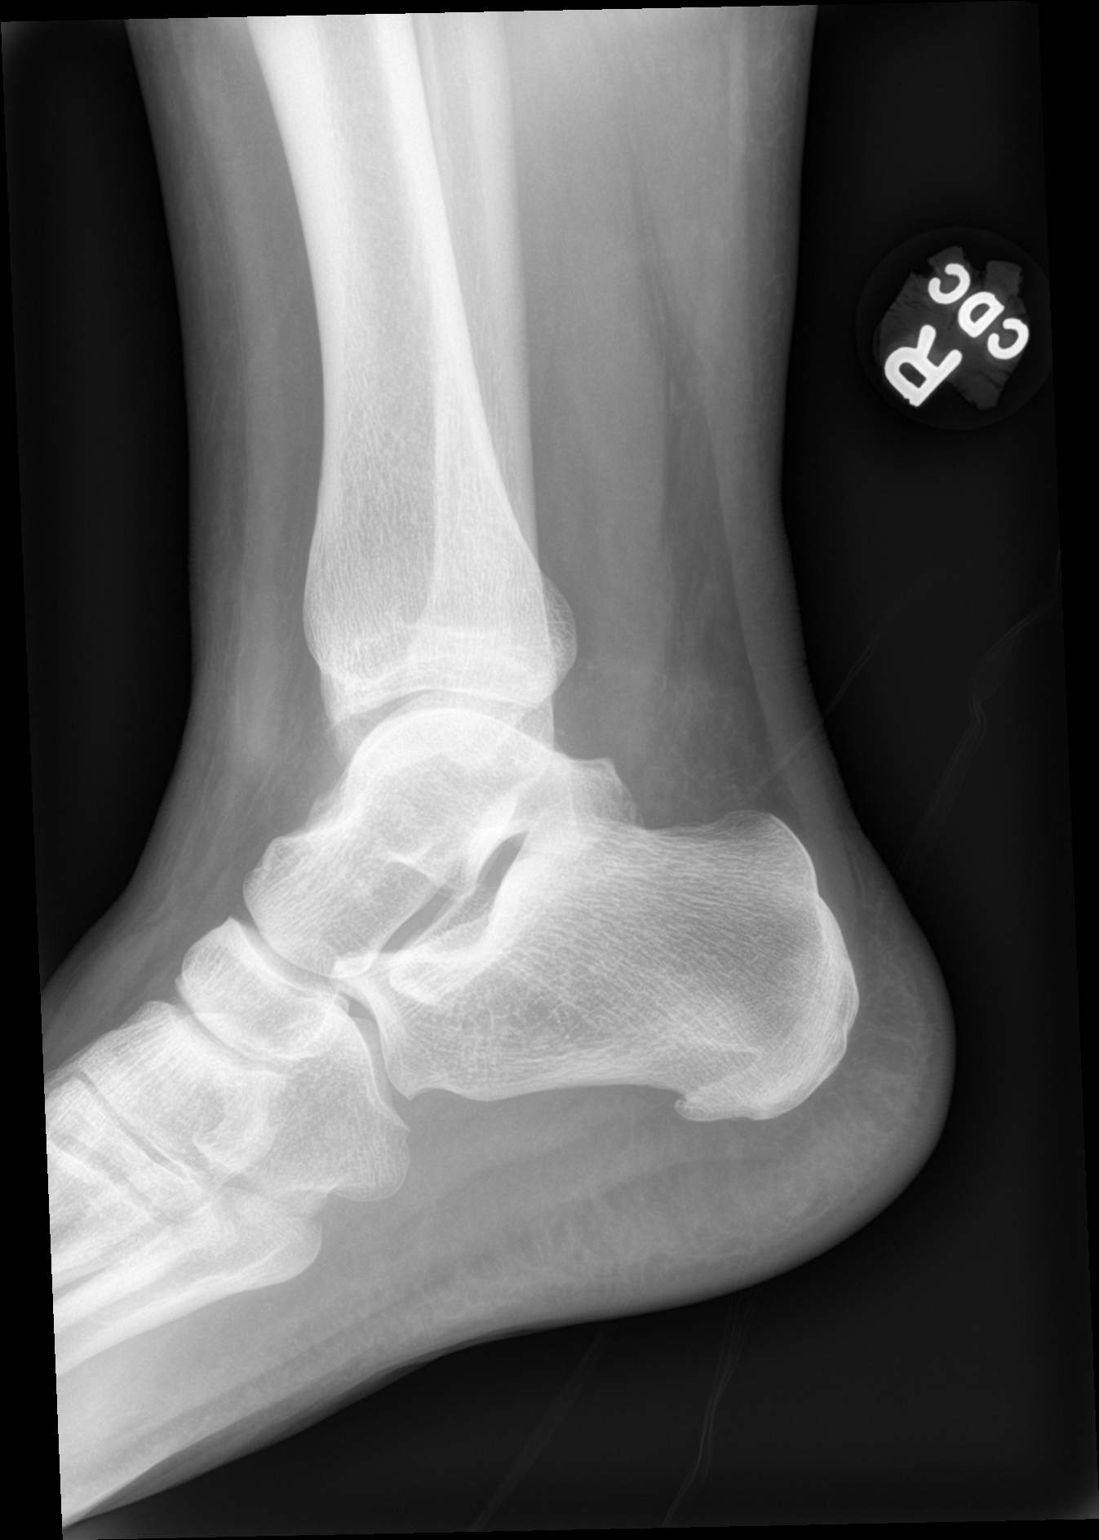

[3 of 3 positions shown; findings below may reference images not displayed]

FINDINGS: There is no evidence of fracture, dislocation, or joint effusion.
The ankle mortise is preserved. There is a small plantar calcaneal
spur. There is no evidence of arthropathy or other focal bone
abnormality. Mild soft tissue edema.
IMPRESSION: Mild soft tissue edema. No fracture or dislocation.
